# Patient Record
Sex: Female | Born: 1985 | Race: White | Hispanic: No | Marital: Married | Smoking: Current every day smoker
Health system: Southern US, Community
[De-identification: ages and names within clinical notes are randomized; demographics above are authoritative.]

## PROBLEM LIST (undated history)

## (undated) DIAGNOSIS — Z8041 Family history of malignant neoplasm of ovary: Secondary | ICD-10-CM

## (undated) DIAGNOSIS — F419 Anxiety disorder, unspecified: Secondary | ICD-10-CM

## (undated) DIAGNOSIS — F53 Postpartum depression: Secondary | ICD-10-CM

## (undated) DIAGNOSIS — F32A Depression, unspecified: Secondary | ICD-10-CM

## (undated) DIAGNOSIS — F410 Panic disorder [episodic paroxysmal anxiety] without agoraphobia: Secondary | ICD-10-CM

## (undated) DIAGNOSIS — R87629 Unspecified abnormal cytological findings in specimens from vagina: Secondary | ICD-10-CM

## (undated) DIAGNOSIS — F1911 Other psychoactive substance abuse, in remission: Secondary | ICD-10-CM

## (undated) DIAGNOSIS — IMO0002 Reserved for concepts with insufficient information to code with codable children: Secondary | ICD-10-CM

## (undated) DIAGNOSIS — F99 Mental disorder, not otherwise specified: Secondary | ICD-10-CM

## (undated) DIAGNOSIS — Z8042 Family history of malignant neoplasm of prostate: Secondary | ICD-10-CM

## (undated) DIAGNOSIS — E079 Disorder of thyroid, unspecified: Secondary | ICD-10-CM

## (undated) DIAGNOSIS — Z800 Family history of malignant neoplasm of digestive organs: Secondary | ICD-10-CM

## (undated) DIAGNOSIS — O99345 Other mental disorders complicating the puerperium: Secondary | ICD-10-CM

## (undated) HISTORY — DX: Family history of malignant neoplasm of digestive organs: Z80.0

## (undated) HISTORY — DX: Other psychoactive substance abuse, in remission: F19.11

## (undated) HISTORY — DX: Family history of malignant neoplasm of prostate: Z80.42

## (undated) HISTORY — DX: Anxiety disorder, unspecified: F41.9

## (undated) HISTORY — DX: Family history of malignant neoplasm of ovary: Z80.41

## (undated) HISTORY — DX: Depression, unspecified: F32.A

## (undated) HISTORY — DX: Unspecified abnormal cytological findings in specimens from vagina: R87.629

## (undated) HISTORY — PX: TUBAL LIGATION: SHX77

## (undated) HISTORY — DX: Mental disorder, not otherwise specified: F99

## (undated) HISTORY — DX: Postpartum depression: F53.0

## (undated) HISTORY — DX: Other mental disorders complicating the puerperium: O99.345

---
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
## 2003-01-03 ENCOUNTER — Encounter: Payer: Self-pay | Admitting: Emergency Medicine

## 2003-01-03 ENCOUNTER — Emergency Department (HOSPITAL_COMMUNITY): Admission: EM | Admit: 2003-01-03 | Discharge: 2003-01-03 | Payer: Self-pay | Admitting: Emergency Medicine

## 2004-03-29 ENCOUNTER — Ambulatory Visit (HOSPITAL_COMMUNITY): Admission: AD | Admit: 2004-03-29 | Discharge: 2004-03-29 | Payer: Self-pay | Admitting: Obstetrics and Gynecology

## 2004-04-05 ENCOUNTER — Ambulatory Visit (HOSPITAL_COMMUNITY): Admission: AD | Admit: 2004-04-05 | Discharge: 2004-04-05 | Payer: Self-pay | Admitting: Obstetrics and Gynecology

## 2004-04-15 ENCOUNTER — Ambulatory Visit (HOSPITAL_COMMUNITY): Admission: AD | Admit: 2004-04-15 | Discharge: 2004-04-15 | Payer: Self-pay | Admitting: Obstetrics & Gynecology

## 2004-04-20 ENCOUNTER — Ambulatory Visit (HOSPITAL_COMMUNITY): Admission: AD | Admit: 2004-04-20 | Discharge: 2004-04-20 | Payer: Self-pay | Admitting: Obstetrics and Gynecology

## 2004-05-13 ENCOUNTER — Ambulatory Visit (HOSPITAL_COMMUNITY): Admission: AD | Admit: 2004-05-13 | Discharge: 2004-05-13 | Payer: Self-pay | Admitting: Obstetrics and Gynecology

## 2004-05-31 ENCOUNTER — Ambulatory Visit (HOSPITAL_COMMUNITY): Admission: AD | Admit: 2004-05-31 | Discharge: 2004-05-31 | Payer: Self-pay | Admitting: Obstetrics and Gynecology

## 2004-06-21 ENCOUNTER — Inpatient Hospital Stay (HOSPITAL_COMMUNITY): Admission: RE | Admit: 2004-06-21 | Discharge: 2004-06-24 | Payer: Self-pay | Admitting: Obstetrics and Gynecology

## 2004-08-24 ENCOUNTER — Emergency Department (HOSPITAL_COMMUNITY): Admission: EM | Admit: 2004-08-24 | Discharge: 2004-08-24 | Payer: Self-pay | Admitting: Emergency Medicine

## 2004-08-30 ENCOUNTER — Ambulatory Visit (HOSPITAL_COMMUNITY): Admission: RE | Admit: 2004-08-30 | Discharge: 2004-08-30 | Payer: Self-pay | Admitting: Family Medicine

## 2004-11-25 ENCOUNTER — Emergency Department (HOSPITAL_COMMUNITY): Admission: EM | Admit: 2004-11-25 | Discharge: 2004-11-25 | Payer: Self-pay | Admitting: Emergency Medicine

## 2005-01-06 ENCOUNTER — Encounter (HOSPITAL_COMMUNITY): Admission: RE | Admit: 2005-01-06 | Discharge: 2005-01-08 | Payer: Self-pay | Admitting: Family Medicine

## 2005-02-14 ENCOUNTER — Emergency Department (HOSPITAL_COMMUNITY): Admission: EM | Admit: 2005-02-14 | Discharge: 2005-02-14 | Payer: Self-pay | Admitting: Emergency Medicine

## 2005-03-04 ENCOUNTER — Encounter (HOSPITAL_COMMUNITY): Admission: RE | Admit: 2005-03-04 | Discharge: 2005-04-03 | Payer: Self-pay | Admitting: Endocrinology

## 2006-01-31 ENCOUNTER — Ambulatory Visit: Payer: Self-pay | Admitting: Oncology

## 2006-04-16 ENCOUNTER — Emergency Department (HOSPITAL_COMMUNITY): Admission: EM | Admit: 2006-04-16 | Discharge: 2006-04-16 | Payer: Self-pay | Admitting: Emergency Medicine

## 2006-08-06 ENCOUNTER — Ambulatory Visit (HOSPITAL_COMMUNITY): Admission: RE | Admit: 2006-08-06 | Discharge: 2006-08-06 | Payer: Self-pay | Admitting: Obstetrics and Gynecology

## 2006-08-23 ENCOUNTER — Ambulatory Visit (HOSPITAL_COMMUNITY): Admission: AD | Admit: 2006-08-23 | Discharge: 2006-08-23 | Payer: Self-pay | Admitting: Obstetrics and Gynecology

## 2006-09-11 ENCOUNTER — Observation Stay (HOSPITAL_COMMUNITY): Admission: RE | Admit: 2006-09-11 | Discharge: 2006-09-12 | Payer: Self-pay | Admitting: Obstetrics and Gynecology

## 2006-10-01 ENCOUNTER — Ambulatory Visit (HOSPITAL_COMMUNITY): Admission: AD | Admit: 2006-10-01 | Discharge: 2006-10-01 | Payer: Self-pay | Admitting: Obstetrics and Gynecology

## 2006-10-14 ENCOUNTER — Inpatient Hospital Stay (HOSPITAL_COMMUNITY): Admission: AD | Admit: 2006-10-14 | Discharge: 2006-10-15 | Payer: Self-pay | Admitting: Obstetrics and Gynecology

## 2006-10-21 ENCOUNTER — Inpatient Hospital Stay (HOSPITAL_COMMUNITY): Admission: AD | Admit: 2006-10-21 | Discharge: 2006-10-21 | Payer: Self-pay | Admitting: Obstetrics and Gynecology

## 2006-10-25 ENCOUNTER — Inpatient Hospital Stay (HOSPITAL_COMMUNITY): Admission: AD | Admit: 2006-10-25 | Discharge: 2006-10-27 | Payer: Self-pay | Admitting: Obstetrics and Gynecology

## 2007-01-17 ENCOUNTER — Ambulatory Visit (HOSPITAL_COMMUNITY): Admission: RE | Admit: 2007-01-17 | Discharge: 2007-01-17 | Payer: Self-pay | Admitting: Family Medicine

## 2007-01-19 ENCOUNTER — Encounter: Payer: Self-pay | Admitting: Orthopedic Surgery

## 2007-03-14 ENCOUNTER — Telehealth: Payer: Self-pay | Admitting: Orthopedic Surgery

## 2007-03-14 ENCOUNTER — Ambulatory Visit: Payer: Self-pay | Admitting: Orthopedic Surgery

## 2007-03-14 DIAGNOSIS — M549 Dorsalgia, unspecified: Secondary | ICD-10-CM | POA: Insufficient documentation

## 2007-03-16 ENCOUNTER — Telehealth: Payer: Self-pay | Admitting: Orthopedic Surgery

## 2007-06-08 ENCOUNTER — Ambulatory Visit (HOSPITAL_COMMUNITY): Admission: RE | Admit: 2007-06-08 | Discharge: 2007-06-08 | Payer: Self-pay | Admitting: Family Medicine

## 2007-07-02 ENCOUNTER — Ambulatory Visit (HOSPITAL_COMMUNITY): Admission: RE | Admit: 2007-07-02 | Discharge: 2007-07-02 | Payer: Self-pay | Admitting: Obstetrics and Gynecology

## 2007-07-08 ENCOUNTER — Encounter: Payer: Self-pay | Admitting: Physician Assistant

## 2007-07-24 ENCOUNTER — Other Ambulatory Visit: Admission: RE | Admit: 2007-07-24 | Discharge: 2007-07-24 | Payer: Self-pay | Admitting: Obstetrics and Gynecology

## 2007-07-24 ENCOUNTER — Encounter: Payer: Self-pay | Admitting: Physician Assistant

## 2007-07-30 ENCOUNTER — Encounter: Payer: Self-pay | Admitting: Physician Assistant

## 2007-07-30 ENCOUNTER — Ambulatory Visit (HOSPITAL_COMMUNITY): Admission: RE | Admit: 2007-07-30 | Discharge: 2007-07-30 | Payer: Self-pay | Admitting: Obstetrics and Gynecology

## 2007-10-20 ENCOUNTER — Emergency Department (HOSPITAL_COMMUNITY): Admission: EM | Admit: 2007-10-20 | Discharge: 2007-10-20 | Payer: Self-pay | Admitting: Emergency Medicine

## 2007-12-05 ENCOUNTER — Emergency Department (HOSPITAL_COMMUNITY): Admission: EM | Admit: 2007-12-05 | Discharge: 2007-12-05 | Payer: Self-pay | Admitting: Emergency Medicine

## 2008-09-13 ENCOUNTER — Emergency Department (HOSPITAL_COMMUNITY): Admission: EM | Admit: 2008-09-13 | Discharge: 2008-09-13 | Payer: Self-pay | Admitting: Emergency Medicine

## 2008-11-02 ENCOUNTER — Emergency Department (HOSPITAL_COMMUNITY): Admission: EM | Admit: 2008-11-02 | Discharge: 2008-11-02 | Payer: Self-pay | Admitting: Emergency Medicine

## 2008-12-21 ENCOUNTER — Emergency Department (HOSPITAL_COMMUNITY): Admission: EM | Admit: 2008-12-21 | Discharge: 2008-12-21 | Payer: Self-pay | Admitting: Emergency Medicine

## 2009-03-11 ENCOUNTER — Other Ambulatory Visit: Admission: RE | Admit: 2009-03-11 | Discharge: 2009-03-11 | Payer: Self-pay | Admitting: Obstetrics & Gynecology

## 2009-03-12 ENCOUNTER — Encounter: Payer: Self-pay | Admitting: Physician Assistant

## 2009-05-01 ENCOUNTER — Emergency Department (HOSPITAL_COMMUNITY): Admission: EM | Admit: 2009-05-01 | Discharge: 2009-05-02 | Payer: Self-pay | Admitting: Emergency Medicine

## 2009-05-15 ENCOUNTER — Ambulatory Visit: Payer: Self-pay | Admitting: Psychiatry

## 2009-05-15 ENCOUNTER — Inpatient Hospital Stay (HOSPITAL_COMMUNITY): Admission: RE | Admit: 2009-05-15 | Discharge: 2009-05-18 | Payer: Self-pay | Admitting: Psychiatry

## 2009-09-24 ENCOUNTER — Emergency Department (HOSPITAL_COMMUNITY): Admission: EM | Admit: 2009-09-24 | Discharge: 2009-09-24 | Payer: Self-pay | Admitting: Emergency Medicine

## 2009-09-26 ENCOUNTER — Inpatient Hospital Stay (HOSPITAL_COMMUNITY): Admission: EM | Admit: 2009-09-26 | Discharge: 2009-09-28 | Payer: Self-pay | Admitting: Emergency Medicine

## 2009-09-28 ENCOUNTER — Inpatient Hospital Stay (HOSPITAL_COMMUNITY): Admission: AD | Admit: 2009-09-28 | Discharge: 2009-10-02 | Payer: Self-pay | Admitting: Psychiatry

## 2009-09-28 ENCOUNTER — Ambulatory Visit: Payer: Self-pay | Admitting: Psychiatry

## 2009-11-10 ENCOUNTER — Ambulatory Visit: Payer: Self-pay | Admitting: Physician Assistant

## 2009-11-10 ENCOUNTER — Other Ambulatory Visit: Admission: RE | Admit: 2009-11-10 | Discharge: 2009-11-10 | Payer: Self-pay | Admitting: Internal Medicine

## 2009-11-10 DIAGNOSIS — N912 Amenorrhea, unspecified: Secondary | ICD-10-CM | POA: Insufficient documentation

## 2009-11-10 DIAGNOSIS — E018 Other iodine-deficiency related thyroid disorders and allied conditions: Secondary | ICD-10-CM | POA: Insufficient documentation

## 2009-11-10 DIAGNOSIS — E039 Hypothyroidism, unspecified: Secondary | ICD-10-CM | POA: Insufficient documentation

## 2009-11-10 DIAGNOSIS — N739 Female pelvic inflammatory disease, unspecified: Secondary | ICD-10-CM | POA: Insufficient documentation

## 2009-11-10 DIAGNOSIS — F1910 Other psychoactive substance abuse, uncomplicated: Secondary | ICD-10-CM | POA: Insufficient documentation

## 2009-11-10 LAB — CONVERTED CEMR LAB
Beta hcg, urine, semiquantitative: NEGATIVE
Bilirubin Urine: NEGATIVE
Glucose, Urine, Semiquant: NEGATIVE
Protein, U semiquant: NEGATIVE
Specific Gravity, Urine: 1.02
pH: 5.5

## 2009-11-11 ENCOUNTER — Encounter: Payer: Self-pay | Admitting: Physician Assistant

## 2009-11-12 LAB — CONVERTED CEMR LAB
ALT: 17 units/L (ref 0–35)
AST: 16 units/L (ref 0–37)
Basophils Absolute: 0 10*3/uL (ref 0.0–0.1)
Basophils Relative: 1 % (ref 0–1)
CO2: 26 meq/L (ref 19–32)
Calcium: 10.4 mg/dL (ref 8.4–10.5)
Chlamydia, DNA Probe: NEGATIVE
Chloride: 103 meq/L (ref 96–112)
Creatinine, Ser: 0.51 mg/dL (ref 0.40–1.20)
GC Probe Amp, Genital: NEGATIVE
Hemoglobin: 14.9 g/dL (ref 12.0–15.0)
Lymphocytes Relative: 19 % (ref 12–46)
MCHC: 33.7 g/dL (ref 30.0–36.0)
Monocytes Absolute: 0.7 10*3/uL (ref 0.1–1.0)
Neutro Abs: 5.9 10*3/uL (ref 1.7–7.7)
Neutrophils Relative %: 72 % (ref 43–77)
Platelets: 270 10*3/uL (ref 150–400)
Potassium: 4.9 meq/L (ref 3.5–5.3)
RDW: 12.5 % (ref 11.5–15.5)
Sodium: 140 meq/L (ref 135–145)
TSH: 0.02 microintl units/mL — ABNORMAL LOW (ref 0.350–4.500)
Total Protein: 7.1 g/dL (ref 6.0–8.3)

## 2009-11-13 ENCOUNTER — Encounter: Payer: Self-pay | Admitting: Physician Assistant

## 2009-11-18 ENCOUNTER — Ambulatory Visit: Payer: Self-pay | Admitting: Physician Assistant

## 2009-11-18 ENCOUNTER — Ambulatory Visit (HOSPITAL_COMMUNITY): Admission: RE | Admit: 2009-11-18 | Discharge: 2009-11-18 | Payer: Self-pay | Admitting: Internal Medicine

## 2009-11-18 DIAGNOSIS — R109 Unspecified abdominal pain: Secondary | ICD-10-CM | POA: Insufficient documentation

## 2009-11-23 ENCOUNTER — Encounter: Payer: Self-pay | Admitting: Physician Assistant

## 2009-11-24 ENCOUNTER — Encounter (INDEPENDENT_AMBULATORY_CARE_PROVIDER_SITE_OTHER): Payer: Self-pay | Admitting: Internal Medicine

## 2009-12-11 ENCOUNTER — Ambulatory Visit: Payer: Self-pay | Admitting: Physician Assistant

## 2009-12-15 LAB — CONVERTED CEMR LAB: TSH: 0.019 microintl units/mL — ABNORMAL LOW (ref 0.350–4.500)

## 2010-01-09 ENCOUNTER — Emergency Department (HOSPITAL_COMMUNITY): Admission: EM | Admit: 2010-01-09 | Discharge: 2010-01-09 | Payer: Self-pay | Admitting: Emergency Medicine

## 2010-01-26 ENCOUNTER — Encounter: Payer: Self-pay | Admitting: Physician Assistant

## 2010-01-26 DIAGNOSIS — F419 Anxiety disorder, unspecified: Secondary | ICD-10-CM | POA: Insufficient documentation

## 2010-01-26 DIAGNOSIS — F411 Generalized anxiety disorder: Secondary | ICD-10-CM

## 2010-01-26 DIAGNOSIS — E785 Hyperlipidemia, unspecified: Secondary | ICD-10-CM | POA: Insufficient documentation

## 2010-03-26 ENCOUNTER — Encounter (INDEPENDENT_AMBULATORY_CARE_PROVIDER_SITE_OTHER): Payer: Self-pay | Admitting: Internal Medicine

## 2010-03-26 ENCOUNTER — Ambulatory Visit: Payer: Self-pay | Admitting: Internal Medicine

## 2010-03-26 DIAGNOSIS — R002 Palpitations: Secondary | ICD-10-CM | POA: Insufficient documentation

## 2010-03-26 DIAGNOSIS — E663 Overweight: Secondary | ICD-10-CM | POA: Insufficient documentation

## 2010-03-26 DIAGNOSIS — B9789 Other viral agents as the cause of diseases classified elsewhere: Secondary | ICD-10-CM | POA: Insufficient documentation

## 2010-03-26 DIAGNOSIS — J039 Acute tonsillitis, unspecified: Secondary | ICD-10-CM | POA: Insufficient documentation

## 2010-03-26 LAB — CONVERTED CEMR LAB: Rapid Strep: NEGATIVE

## 2010-03-30 LAB — CONVERTED CEMR LAB
Mono Screen: NEGATIVE
TSH: 0.032 microintl units/mL — ABNORMAL LOW (ref 0.350–4.500)

## 2010-05-02 ENCOUNTER — Encounter: Payer: Self-pay | Admitting: Orthopedic Surgery

## 2010-05-12 ENCOUNTER — Ambulatory Visit: Admit: 2010-05-12 | Payer: Self-pay | Admitting: Internal Medicine

## 2010-05-13 NOTE — Letter (Signed)
Summary: [facility]DS [facility]  [facility] REC[facility]DS FROM [facility]   Imported By: Arta Bruce 12/01/2009 12:20:34  _____________________________________________________________________  External Attachment:    Type:   Image     Comment:   External Document

## 2010-05-13 NOTE — Letter (Signed)
Summary: PT INFORMATION SHEET  PT INFORMATION SHEET   Imported By: Arta Bruce 12/11/2009 15:44:59  _____________________________________________________________________  External Attachment:    Type:   Image     Comment:   External Document

## 2010-05-13 NOTE — Assessment & Plan Note (Signed)
Summary: pelvic discharge//ss   Vital Signs:  Patient profile:   25 year old female Height:      66.5 inches Weight:      168 pounds BMI:     26.81 Temp:     97.9 degrees F oral Pulse rate:   102 / minute Pulse rhythm:   regular Resp:     18 per minute BP sitting:   109 / 72  (left arm) Cuff size:   regular  Vitals Entered By: Armenia Shannon (November 10, 2009 8:44 AM) CC: NP... pt says she has a discharge (yellowish tan)and has cramping .Marland Kitchen.. pt says she is not sexually active... pt says she has not had a cycle this month Is Patient Diabetic? No Pain Assessment Patient in pain? no       Does patient need assistance? Functional Status Self care Ambulation Normal   Primary Care Provider:  Tereso Newcomer, PA-C  CC:  NP... pt says she has a discharge (yellowish tan)and has cramping .Marland Kitchen.. pt says she is not sexually active... pt says she has not had a cycle this month.  History of Present Illness: New patient.  Moved here from [place].  Psych:  Seeing Dr. Maisie Fus Heading.  Referred to psych after d/c from hospital.  [facility] set her up with the referral after d/c.  This is a substance abuse program.  Lives there.  She was abusing pain meds and had experimented with bath salts.  She was admx in 09/2009 and was followed for several days.  She had not signs of rhabdomyolysis and the hosp. team was in consult with poison control.  She stayed in [facility] for a week.  Has been in [facility] for about a month.  States she is clean (no alcohol or drugs).  Does smoke cigs.  Vaginal discharge and pelvic pain:  No period since beginning of June.  Had abnl pap in Jan.  Never had f/u.  Had cryosurgery at age 43 for "cancer."  Periods usually regular.  Never had any problems until 2 months ago.  Suprapubic pressure.  No dysuria.  No frequency or hesitancy.  Vaginal discharge is yellowish or tan color.  Pain is constant.  No intercourse since July 3.  Does think she may have had multiple  partners.  She lost several days when on drugs recently.  HIV, RPR and GC tests were neg in hosp in June.  Discharge was not noted until recently.    Current Medications (verified): 1)  Synthroid 125 Mcg Tabs (Levothyroxine Sodium) .... Two Tabs Once A Day  Allergies (verified): 1)  ! Pcn 2)  ! Amoxicillin  Past History:  Past Medical History: Graves Disease   a.  s/p Radioactive Iodine   b.  hypothyroidism migraines cervical cancer at age 80 - cryosurgery  Past Surgical History:  Tubal ligation (2008)  Family History: FH of Cancer: Mom - Breast (died at 14) Family History of Diabetes - grandfather CAD - PGF   Social History: Patient is married.  2 kids Recovering addict   a.  abused prescription pain meds and  bath salts   b.  admx in 09/2009   c.  in [facility] for 10/2009 to 10/2010 + cigs (4 cigs a day)  Physical Exam  General:  alert, well-developed, and well-nourished.   Head:  normocephalic and atraumatic.   Neck:  supple and no thyromegaly.   Lungs:  normal breath sounds.   Heart:  normal rate and regular  rhythm.   Abdomen:  soft, normal bowel sounds, and no hepatomegaly.   Genitalia:  normal introitus, no external lesions, mucosa pink and moist, and no vaginal atrophy.   + CMT + adnexal tenderness  Neurologic:  alert & oriented X3 and cranial nerves II-XII intact.   Psych:  normally interactive.     Impression & Recommendations:  Problem # 1:  PELVIC INFLAMMATORY DISEASE (ICD-614.9)  with her symptoms I am concerned she has PID will treat her for such PCN allergic . Marland Kitchen . will not give rocephin doxy and flagyl x 2 weeks  Orders: T-Culture, Urine ([id number]) T- GC Chlamydia (56213) T-Pap Smear, Thin Prep (08657) KOH/ WET [place] [phone]) T-CBC w/Diff 250 143 9020) T-Urinalysis (40102-72536)  Problem # 2:  PREVENTIVE HEALTH CARE (ICD-V70.0)  has h/o abnl paps pap done today refer to gyn if abnl  Orders: T- GC Chlamydia (64403) T-Pap  Smear, Thin Prep (47425) KOH/ WET [place] [phone]) T-Comprehensive Metabolic Panel [phone])  Problem # 3:  SUBSTANCE ABUSE, MULTIPLE (ICD-305.90)  recovering clean for a month now   Orders: T-Comprehensive Metabolic Panel ([id number])  Problem # 4:  HYPOTHYROIDISM, 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 Two tabs once a day 2)  Doxycycline Hyclate 100 Mg Tabs (Doxycycline hyclate) .... Take 1 tablet by mouth two times a day 3)  Flagyl 500 Mg Tabs (Metronidazole) .... Take 1 tablet by mouth two times a day  Other Orders: Urine Pregnancy Test  (70623)  Patient Instructions: 1)  Please request records from [facility] (Dr. Emelda Fear) in [place]. 2)  Take antibiotics until all gone. 3)  Follow up with Scott in 7 to 10 days. Prescriptions: FLAGYL 500 MG TABS (METRONIDAZOLE) Take 1 tablet by mouth two times a day  #28 x 0   Entered and Authorized by:   Tereso Newcomer PA-C   Signed by:   Tereso Newcomer PA-C on 11/10/2009   Method used:   Print then Give to Patient   RxID:   [id number] DOXYCYCLINE HYCLATE 100 MG TABS (DOXYCYCLINE HYCLATE) Take 1 tablet by mouth two times a day  #28 x 0   Entered and Authorized by:   Tereso Newcomer PA-C   Signed by:   Tereso Newcomer PA-C on 11/10/2009   Method used:   Print then Give to Patient   RxID:   [id number]   Laboratory Results   Urine Tests    Routine Urinalysis   Glucose: negative   (Normal Range: Negative) Bilirubin: negative   (Normal Range: Negative) Ketone: trace (5)   (Normal Range: Negative) Spec. Gravity: 1.020   (Normal Range: 1.003-1.035) Blood: negative   (Normal Range: Negative) pH: 5.5   (Normal Range: 5.0-8.0) Protein: negative    (Normal Range: Negative) Urobilinogen: 0.2   (Normal Range: 0-1) Nitrite: negative   (Normal Range: Negative) Leukocyte Esterace: trace   (Normal Range: Negative)    Urine HCG: negative

## 2010-05-13 NOTE — Letter (Signed)
Summary: ANCILLARY TESTING REPORT  ANCILLARY TESTING REPORT   Imported By: Arta Bruce 02/05/2010 12:45:59  _____________________________________________________________________  External Attachment:    Type:   Image     Comment:   External Document

## 2010-05-13 NOTE — Letter (Signed)
Summary: GYNECOLOGIC CYTOLOGY REPORT  GYNECOLOGIC CYTOLOGY REPORT   Imported By: Arta Bruce 02/05/2010 12:50:22  _____________________________________________________________________  External Attachment:    Type:   Image     Comment:   External Document

## 2010-05-13 NOTE — Assessment & Plan Note (Signed)
Summary: 3 MONTH FU ON THYROID//KT   Vital Signs:  Patient profile:   25 year old female Menstrual status:  regular LMP:     03/19/2010 Weight:      179.25 pounds Temp:     97.0 degrees F oral Pulse rate:   96 / minute Pulse rhythm:   regular Resp:     12 per minute BP sitting:   112 / 68  (left arm) Cuff size:   regular  Vitals Entered By: Hale Drone CMA (March 26, 2010 9:57 AM) CC: 3 month f/u on thryoid. Feeling sick. Coughing, congested, and body aching x4 days. Denies fever, vomiting and diarrhea. Roomate has Mono and she is concerned. Also went to the ER in October for chest pains.  Is Patient Diabetic? No Pain Assessment Patient in pain? no       Does patient need assistance? Functional Status Self care Ambulation Normal LMP (date): 03/19/2010     Menstrual Status regular Enter LMP: 03/19/2010 Last PAP Result NEGATIVE FOR INTRAEPITHELIAL LESIONS OR MALIGNANCY.   Primary Care Provider:  Tereso Newcomer, PA-C  CC:  3 month f/u on thryoid. Feeling sick. Coughing, congested, and body aching x4 days. Denies fever, and vomiting and diarrhea. Roomate has Mono and she is concerned. Also went to the ER in October for chest pains. .  History of Present Illness: 1.  Generalized myalgias, nonproductive cough, congestion for about 4-5 days.  No fevers.  No dyspnea.  No N, V, diarrhea.    Throat feels swollen, but not painful Roommate with mono--diagnosed 2 weeks ago.   Has not noted lymph nodes in anterior cervical area to feel swollen.  Taking fluids and eating okay.    2.  Hypothyroidism:   Was on 200 micrograms previously and TSH suppressed.  Has been on 175 since September.  Pt. has not noted significant change in symptoms with decrease.    3.  Overweight:  Pt. is working with nutritionist.  Weight at last visit was listed at 144--inactuality was 164 per pt., which makes more sense.    4.  Chest Pain:  No longer with pain.  Felt to be noncardiac.  Ddimer, CIEs, CXR, EKG  other than isolated PVCs normal.  Just now having fluttering in chest--less frequent than when seen in ED--no mention of sustained ventricular arrhythmia from ED.  Was noted to be overly replaced with thyroid during same time period.  Current Medications (verified): 1)  Synthroid 175 Mcg Tabs (Levothyroxine Sodium) .... Take 1 Tablet By Mouth Once A Day (Dose Decreased)  Allergies (verified): 1)  ! Pcn 2)  ! Amoxicillin  Physical Exam  General:  NAD--sounds congested Eyes:  No corneal or conjunctival inflammation noted. EOMI. Perrla. Funduscopic exam benign, without hemorrhages, exudates or papilledema. Vision grossly normal. Ears:  External ear exam shows no significant lesions or deformities.  Otoscopic examination reveals clear canals, tympanic membranes are intact bilaterally without bulging, retraction, inflammation or discharge. Hearing is grossly normal bilaterally. Nose:  mild nasal mucosal swelling with clear discharge Mouth:  Uvula and tonsillar area erythematous with some small hemorrhages.  No exudate noted. Neck:  No anterior cervical adenopathy,NT.  No mass Lungs:  Normal respiratory effort, chest expands symmetrically. Lungs are clear to auscultation, no crackles or wheezes. Heart:  Normal rate and regular rhythm with occasional premature beat.  Borderline tachycardia. S1 and S2 normal without gallop, murmur, click, rub or other extra sounds.   Impression & Recommendations:  Problem # 1:  VIRAL INFECTION,  ACUTE (ICD-079.99) Discussed may have mono and testing this early may be negative, but will check. Supportive care Good respiratory hygiene to avoid passing onto daughter and others Orders: T- * Misc. Laboratory test [phone]) Rapid Strep (60454) Negative as suspected  Problem # 2:  PALPITATIONS (ICD-785.1)  Orders: EKG w/ Interpretation (93000)  Occasional pvc--HR down to 74 with EKG--may not be related to Synthroid oversuppresion  Problem # 3:  HYPOTHYROIDISM,  POST-RADIOACTIVE IODINE (ICD-244.2) Palpitations may be related to this Her updated medication list for this problem includes:    Synthroid 175 Mcg Tabs (Levothyroxine sodium) .Marland Kitchen... Take 1 tablet by mouth once a day (dose decreased)  Orders: T-TSH ([id number])  Problem # 4:  OVERWEIGHT (ICD-278.02) Pt. continuing to work with Nutrition  Complete Medication List: 1)  Synthroid 175 Mcg Tabs (Levothyroxine sodium) .... Take 1 tablet by mouth once a day (dose decreased)  Other Orders: Flu Vaccine 77yrs + (29562) Admin 1st Vaccine (13086)  Patient Instructions: 1)  Ibuprofen 600-800 mg with soft food every 6 hours as needed for sore throat, swelling or if develop fever 2)  Cool liquids--push this 3)  Good hand washing 4)  cough into elbow 5)  Do not share drinks, cups, utensils   Orders Added: 1)  Flu Vaccine 63yrs + [90658] 2)  Admin 1st Vaccine [90471] 3)  Est. Patient Level IV [57846] 4)  EKG w/ Interpretation [93000] 5)  T-TSH [96295-28413] 6)  T- * Misc. Laboratory test [99999] 7)  Rapid Strep [87880]    Laboratory Results  Date/Time Received: March 26, 2010 11:07 AM   Other Tests  Rapid Strep: negative

## 2010-05-13 NOTE — Letter (Signed)
Summary: MED /SOLUTIONS//APPROVED  MED /SOLUTIONS//APPROVED   Imported By: Arta Bruce 11/23/2009 12:47:46  _____________________________________________________________________  External Attachment:    Type:   Image     Comment:   External Document

## 2010-05-13 NOTE — Op Note (Signed)
Summary: Operative Report//[place]  Operative Report//[place]   Imported By: Arta Bruce 02/05/2010 12:48:59  _____________________________________________________________________  External Attachment:    Type:   Image     Comment:   External Document

## 2010-05-13 NOTE — Miscellaneous (Signed)
  Clinical Lists Changes  Problems: Added new problem of ANXIETY (ICD-300.00) Added new problem of HYPERLIPIDEMIA (ICD-272.4) Observations: Added new observation of HYPRLIPIDMIA: yes (01/26/2010 16:55) Added new observation of PMH ANXIETY: yes (01/26/2010 16:55) Added new observation of PAST MED HX: Graves Disease   a.  s/p Radioactive Iodine   b.  hypothyroidism migraines cervical cancer at age 25 - cryosurgery   a.  LGSIL 12.2010 Anxiety Hyperlipidemia  (01/26/2010 16:55)       Past History:  Past Medical History: Graves Disease   a.  s/p Radioactive Iodine   b.  hypothyroidism migraines cervical cancer at age 13 - cryosurgery   a.  LGSIL 12.2010 Anxiety Hyperlipidemia

## 2010-05-13 NOTE — Assessment & Plan Note (Signed)
Summary: FU WITH SCOTT IN 7-TO DAYS//GK   Vital Signs:  Patient profile:   25 year old female Height:      66.5 inches Weight:      144 pounds BMI:     22.98 Temp:     97.7 degrees F oral Pulse rate:   92 / minute Pulse rhythm:   regular Resp:     18 per minute BP sitting:   110 / 74  (left arm) Cuff size:   large  Vitals Entered By: Arm[place] Shannon (November 18, 2009 3:07 PM) CC: follow-up visit, still having pelvic pain Is Patient Diabetic? No Pain Assessment Patient in pain? yes     Location: pelvis Intensity: 5 Type: sharp  Does patient need assistance? Functional Status Self care Ambulation Normal   Primary Care Provider:  Tereso Newcomer, PA-C  CC:  follow-up visit and still having pelvic pain.  History of Present Illness: Here for f/u.  GC and Chlam tests were neg.  Pap normal.  Tranvag u/s done today was ok.  She had a lot of pain after this.  Still have pelvic pressure.  No intercourse.  No more discharge.  DId have fevers this past weekend (had chills).  Now has some mild URI symptoms.    Current Medications (verified): 1)  Synthroid 125 Mcg Tabs (Levothyroxine Sodium) .... Take 1 By Mouth Once Daily With 75 Micrograms Tablet To Equal 200 Micrograms 2)  Doxycycline Hyclate 100 Mg Tabs (Doxycycline Hyclate) .... Take 1 Tablet By Mouth Two Times A Day 3)  Flagyl 500 Mg Tabs (Metronidazole) .... Take 1 Tablet By Mouth Two Times A Day 4)  Synthroid 75 Mcg Tabs (Levothyroxine Sodium) .... Take 1 Tablet By Mouth Once A Day With 125 Micrograms Tablet To Equal 200 Micrograms  Allergies (verified): 1)  ! Pcn 2)  ! Amoxicillin  Physical Exam  General:  alert, well-developed, and well-nourished.   Head:  normocephalic and atraumatic.   Neck:  supple and no thyromegaly.   Lungs:  normal breath sounds.   Heart:  normal rate and regular rhythm.   Abdomen:  soft, normal bowel sounds, and no hepatomegaly. + pelvic pain with palp   Neurologic:  alert & oriented X3 and  cranial nerves II-XII intact.     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 Take 1 by mouth once daily with 75 micrograms tablet to equal 200 micrograms    Synthroid 75 Mcg Tabs (Levothyroxine sodium) .Marland Kitchen... Take 1 tablet by mouth once a day with 125 micrograms tablet to equal 200 micrograms  Problem # 2:  PELVIC  PAIN (ICD-789.09)  tx for PID gc and chlam neg pap ok u/s normal ? etiology of pain will send to gyn for eval  Orders: Gynecologic Referral (Gyn)  Problem # 3:  AMENORRHEA (ICD-626.0) ? related to thyroid when levels ok, could try progesterone challenge if still no period  Complete Medication List: 1)  Synthroid 125 Mcg Tabs (Levothyroxine sodium) .... Take 1 by mouth once daily with 75 micrograms tablet to equal 200 micrograms 2)  Doxycycline Hyclate 100 Mg Tabs (Doxycycline hyclate) .... Take 1 tablet by mouth two times a day 3)  Flagyl 500 Mg Tabs (Metronidazole) .... Take 1 tablet by mouth two times a day 4)  Synthroid 75 Mcg Tabs (Levothyroxine sodium) .Marland KitchenMarland KitchenMarland Kitchen  Take 1 tablet by mouth once a day with 125 micrograms tablet to equal 200 micrograms  Patient Instructions: 1)  Arrange lab visit for week of 12/11/2009 for TSH, Free T4 and Free T3 (244.2). 2)  Your periods may normalize once your thyroid is normal. 3)  If your periods do not return after we stop adjusting your medicine (give it at least 6 weeks on a stable dose), schedule a follow up. 4)  Someone will call you to set up an appointment with gynecology.  Please call me if you do not hear anything in 2 weeks. 5)  You can take Ibuprofen 600 mg with food every 6-8 hours as needed for pain. 6)  Please schedule a follow-up appointment in 3 months for thyroid.

## 2010-05-13 NOTE — Letter (Signed)
Summary: GYNECOLOGIC CYTOLOGY REPORT  GYNECOLOGIC CYTOLOGY REPORT   Imported By: Arta Bruce 02/05/2010 12:44:26  _____________________________________________________________________  External Attachment:    Type:   Image     Comment:   External Document

## 2010-05-13 NOTE — Letter (Signed)
Summary: *HSN Results Follow up  [facility]  [address] [place], [place] [address]   Phone: [phone]  Fax: [phone]      11/13/2009   [place] [facility] [address] [place], [place]  [address]   Dear  Ms. Shellee Whatley,                            ____S.Drinkard,FNP   ____D. Gore,FNP       ____B. McPherson,MD   ____V. Rankins,MD    ____E. Mulberry,MD    ____N. Daphine Deutscher, FNP  ____D. Reche Dixon, MD    ____K. Philipp Deputy, MD    __x__S. Alben Spittle, PA-C     This letter is to inform you that your recent test(s):  ___x____Pap Smear   is normal  ___x____Lab Test  - urine culture negative for bladder infectoin   _______X-ray    _______ is within acceptable limits  _______ requires a medication change  _______ requires a follow-up lab visit  _______ requires a follow-up visit with your provider   Comments:       _________________________________________________________ If you have any questions, please contact our office                     Sincerely,  Tereso Newcomer PA-C [facility]

## 2010-05-13 NOTE — Letter (Signed)
Summary: RADIOLOGY REPORT  RADIOLOGY REPORT   Imported By: Arta Bruce 02/05/2010 12:42:45  _____________________________________________________________________  External Attachment:    Type:   Image     Comment:   External Document

## 2010-05-17 ENCOUNTER — Encounter (INDEPENDENT_AMBULATORY_CARE_PROVIDER_SITE_OTHER): Payer: Self-pay | Admitting: Internal Medicine

## 2010-06-24 LAB — COMPREHENSIVE METABOLIC PANEL
AST: 22 U/L (ref 0–37)
Albumin: 4 g/dL (ref 3.5–5.2)
BUN: 11 mg/dL (ref 6–23)
Creatinine, Ser: 0.6 mg/dL (ref 0.4–1.2)
GFR calc Af Amer: >60 mL/min (ref >60)
Potassium: 3.7 mEq/L (ref 3.5–5.1)
Total Protein: 6.9 g/dL (ref 6.0–8.3)

## 2010-06-24 LAB — CBC
MCH: 30.9 pg (ref 26.0–34.0)
MCV: 88.1 fL (ref 78.0–100.0)
Platelets: 199 10*3/uL (ref 150–400)
RDW: 12.3 % (ref 11.5–15.5)
WBC: 9.1 10*3/uL (ref 4.0–10.5)

## 2010-06-24 LAB — DIFFERENTIAL
Lymphocytes Relative: 25 % (ref 12–46)
Lymphs Abs: 2.3 10*3/uL (ref 0.7–4.0)
Monocytes Absolute: 0.8 10*3/uL (ref 0.1–1.0)
Monocytes Relative: 9 % (ref 3–12)
Neutro Abs: 5.8 10*3/uL (ref 1.7–7.7)

## 2010-06-24 LAB — POCT CARDIAC MARKERS
CKMB, poc: <1 ng/mL — ABNORMAL LOW (ref 1.0–8.0)
Troponin i, poc: <0.05 ng/mL (ref 0.00–0.09)

## 2010-06-24 LAB — D-DIMER, QUANTITATIVE: D-Dimer, Quant: <0.22 ug/mL-FEU (ref 0.00–0.48)

## 2010-06-27 LAB — URINE CULTURE
Colony Count: NO GROWTH
Culture: NO GROWTH
Special Requests: POSITIVE

## 2010-06-27 LAB — URINALYSIS, ROUTINE W REFLEX MICROSCOPIC
Glucose, UA: NEGATIVE mg/dL
Specific Gravity, Urine: 1.027 (ref 1.005–1.030)
Urobilinogen, UA: 1 mg/dL (ref 0.0–1.0)
pH: 6 (ref 5.0–8.0)

## 2010-06-27 LAB — CARDIAC PANEL(CRET KIN+CKTOT+MB+TROPI)
Relative Index: INVALID (ref 0.0–2.5)
Relative Index: INVALID (ref 0.0–2.5)

## 2010-06-27 LAB — BASIC METABOLIC PANEL
CO2: 27 mEq/L (ref 19–32)
CO2: 29 mEq/L (ref 19–32)
Calcium: 7.8 mg/dL — ABNORMAL LOW (ref 8.4–10.5)
Calcium: 8.3 mg/dL — ABNORMAL LOW (ref 8.4–10.5)
Chloride: 108 mEq/L (ref 96–112)
Chloride: 112 mEq/L (ref 96–112)
GFR calc Af Amer: >60 mL/min (ref >60)
GFR calc non Af Amer: >60 mL/min (ref >60)
GFR calc non Af Amer: >60 mL/min (ref >60)
Glucose, Bld: 112 mg/dL — ABNORMAL HIGH (ref 70–99)
Glucose, Bld: 126 mg/dL — ABNORMAL HIGH (ref 70–99)
Glucose, Bld: 97 mg/dL (ref 70–99)
Potassium: 3.4 mEq/L — ABNORMAL LOW (ref 3.5–5.1)
Potassium: 3.7 mEq/L (ref 3.5–5.1)
Sodium: 143 mEq/L (ref 135–145)
Sodium: 143 mEq/L (ref 135–145)
Sodium: 143 mEq/L (ref 135–145)

## 2010-06-27 LAB — MAGNESIUM: Magnesium: 2.1 mg/dL (ref 1.5–2.5)

## 2010-06-27 LAB — DIFFERENTIAL
Basophils Absolute: 0 10*3/uL (ref 0.0–0.1)
Basophils Relative: 0 % (ref 0–1)
Eosinophils Absolute: 0.1 10*3/uL (ref 0.0–0.7)
Eosinophils Relative: 1 % (ref 0–5)
Neutrophils Relative %: 65 % (ref 43–77)

## 2010-06-27 LAB — COMPREHENSIVE METABOLIC PANEL
ALT: 18 U/L (ref 0–35)
AST: 16 U/L (ref 0–37)
CO2: 28 mEq/L (ref 19–32)
Calcium: 7.8 mg/dL — ABNORMAL LOW (ref 8.4–10.5)
Chloride: 111 mEq/L (ref 96–112)
GFR calc Af Amer: >60 mL/min (ref >60)
GFR calc non Af Amer: >60 mL/min (ref >60)
Glucose, Bld: 137 mg/dL — ABNORMAL HIGH (ref 70–99)
Sodium: 142 mEq/L (ref 135–145)
Total Bilirubin: 0.3 mg/dL (ref 0.3–1.2)

## 2010-06-27 LAB — CBC
HCT: 39.8 % (ref 36.0–46.0)
Hemoglobin: 12.2 g/dL (ref 12.0–15.0)
Hemoglobin: 13.1 g/dL (ref 12.0–15.0)
MCHC: 33.4 g/dL (ref 30.0–36.0)
MCV: 95.3 fL (ref 78.0–100.0)
MCV: 95.5 fL (ref 78.0–100.0)
RBC: 3.85 MIL/uL — ABNORMAL LOW (ref 3.87–5.11)
RDW: 12.9 % (ref 11.5–15.5)
WBC: 8.3 10*3/uL (ref 4.0–10.5)

## 2010-06-27 LAB — RAPID URINE DRUG SCREEN, HOSP PERFORMED
Barbiturates: NOT DETECTED
Opiates: NOT DETECTED
Tetrahydrocannabinol: POSITIVE — AB

## 2010-06-27 LAB — HEPATIC FUNCTION PANEL
Albumin: 3.3 g/dL — ABNORMAL LOW (ref 3.5–5.2)
Alkaline Phosphatase: 56 U/L (ref 39–117)
Indirect Bilirubin: 0.6 mg/dL (ref 0.3–0.9)
Total Bilirubin: 0.7 mg/dL (ref 0.3–1.2)

## 2010-06-27 LAB — CK TOTAL AND CKMB (NOT AT ARMC)
Relative Index: INVALID (ref 0.0–2.5)
Total CK: 67 U/L (ref 7–177)

## 2010-06-27 LAB — URINE MICROSCOPIC-ADD ON

## 2010-06-27 LAB — HIV ANTIBODY (ROUTINE TESTING W REFLEX): HIV: NONREACTIVE

## 2010-06-27 LAB — T4, FREE: Free T4: 2.3 ng/dL — ABNORMAL HIGH (ref 0.80–1.80)

## 2010-06-27 LAB — SALICYLATE LEVEL: Salicylate Lvl: <4 mg/dL (ref 2.8–20.0)

## 2010-06-27 LAB — TROPONIN I: Troponin I: 0.02 ng/mL (ref 0.00–0.06)

## 2010-06-28 LAB — COMPREHENSIVE METABOLIC PANEL
ALT: 11 U/L (ref 0–35)
AST: 18 U/L (ref 0–37)
Albumin: 3.9 g/dL (ref 3.5–5.2)
Alkaline Phosphatase: 70 U/L (ref 39–117)
BUN: 6 mg/dL (ref 6–23)
CO2: 31 mEq/L (ref 19–32)
Calcium: 9.2 mg/dL (ref 8.4–10.5)
Chloride: 102 mEq/L (ref 96–112)
Creatinine, Ser: 0.7 mg/dL (ref 0.4–1.2)
GFR calc Af Amer: >60 mL/min (ref >60)
GFR calc non Af Amer: >60 mL/min (ref >60)
Glucose, Bld: 103 mg/dL — ABNORMAL HIGH (ref 70–99)
Potassium: 3.7 mEq/L (ref 3.5–5.1)
Sodium: 141 mEq/L (ref 135–145)
Total Bilirubin: 0.4 mg/dL (ref 0.3–1.2)
Total Protein: 7.5 g/dL (ref 6.0–8.3)

## 2010-06-28 LAB — RAPID URINE DRUG SCREEN, HOSP PERFORMED
Amphetamines: NOT DETECTED
Barbiturates: NOT DETECTED
Benzodiazepines: NOT DETECTED
Cocaine: NOT DETECTED
Opiates: NOT DETECTED
Tetrahydrocannabinol: POSITIVE — AB

## 2010-06-28 LAB — CBC
HCT: 44.9 % (ref 36.0–46.0)
Hemoglobin: 15 g/dL (ref 12.0–15.0)
MCHC: 33.5 g/dL (ref 30.0–36.0)
MCV: 93.4 fL (ref 78.0–100.0)
Platelets: 309 10*3/uL (ref 150–400)
RBC: 4.81 MIL/uL (ref 3.87–5.11)
RDW: 13.4 % (ref 11.5–15.5)
WBC: 14.4 10*3/uL — ABNORMAL HIGH (ref 4.0–10.5)

## 2010-06-28 LAB — DIFFERENTIAL
Basophils Absolute: 0 10*3/uL (ref 0.0–0.1)
Basophils Relative: 0 % (ref 0–1)
Eosinophils Absolute: 0 10*3/uL (ref 0.0–0.7)
Eosinophils Relative: 0 % (ref 0–5)
Lymphocytes Relative: 9 % — ABNORMAL LOW (ref 12–46)
Lymphs Abs: 1.2 10*3/uL (ref 0.7–4.0)
Monocytes Absolute: 0.4 10*3/uL (ref 0.1–1.0)
Monocytes Relative: 3 % (ref 3–12)
Neutro Abs: 12.7 10*3/uL — ABNORMAL HIGH (ref 1.7–7.7)
Neutrophils Relative %: 88 % — ABNORMAL HIGH (ref 43–77)

## 2010-06-28 LAB — ETHANOL: Alcohol, Ethyl (B): <5 mg/dL (ref 0–10)

## 2010-07-01 LAB — COMPREHENSIVE METABOLIC PANEL
ALT: 12 U/L (ref 0–35)
Calcium: 8.9 mg/dL (ref 8.4–10.5)
GFR calc Af Amer: >60 mL/min (ref >60)
Glucose, Bld: 98 mg/dL (ref 70–99)
Sodium: 135 mEq/L (ref 135–145)
Total Protein: 7.5 g/dL (ref 6.0–8.3)

## 2010-07-01 LAB — CBC
Hemoglobin: 14.8 g/dL (ref 12.0–15.0)
MCHC: 34 g/dL (ref 30.0–36.0)
RDW: 13.8 % (ref 11.5–15.5)

## 2010-07-01 LAB — TSH: TSH: 10.08 u[IU]/mL — ABNORMAL HIGH (ref 0.350–4.500)

## 2010-08-24 NOTE — Op Note (Signed)
[name], [name]                ACCOUNT NO.:  [id number]   MEDICAL RECORD NO.:  [id number]          PATIENT TYPE:  INP   LOCATION:  9118                          FACILITY:  [facility]   PHYSICIAN:  Lazaro Arms, M.D.   DATE OF BIRTH:  06-May-1985   DATE OF PROCEDURE:  10/26/2006  DATE OF DISCHARGE:                               OPERATIVE REPORT   Kelly Richard is a 25 year old white female, gravida 3, para 1, abortus 1 with  estimated date of delivery July 20th at 39-5/7 weeks of gestation.  The  baby has progressed normally through active phase of labor.  She had an  epidural placed.  She is complete and found to be complete and +3  station.  She had two maternal expulsive efforts.  Over an intact  perineum delivered a viable female infant at 34 with Apgars of 9 and  9, weighing 6 pounds, 11 ounces.  There was a three vessel cord.  Cord  blood and cord gas were sent.  Cord blood was collected by [facility].  The placenta was delivered intact.  The uterus was firm,  three finger breadths below the umbilicus.  There was a small abrasion  of the right vulva, otherwise the perineum was intact.  The patient is  to undergo routine postpartum care.  She does have a history of  hypothyroidism and chronic narcotic abuse.  She has made good efforts to  try to wean down those medications during her pregnancy and that will  continue in the postpartum period.  She does want to have an Implenon as  her birth control method.      Lazaro Arms, M.D.  Electronically Signed     LHE/MEDQ  D:  10/26/2006  T:  10/26/2006  Job:  [id number]

## 2010-08-24 NOTE — H&P (Signed)
NAMEROSARY, FILOSA                ACCOUNT NO.:  [id number]   MEDICAL RECORD NO.:  [id number]          PATIENT TYPE:  OIB   LOCATION:  LDR2                          FACILITY:  [facility]   PHYSICIAN:  Tilda Burrow, M.D. DATE OF BIRTH:  1986/01/24   DATE OF ADMISSION:  09/11/2006  DATE OF DISCHARGE:  [facility]                              HISTORY & PHYSICAL   OBSERVATION ADMISSION:   REASON FOR ADMISSION:  Pregnancy at 33 weeks and 3 days with  questionable rupture of membranes.   ALLERGIES:  She is allergic to PENICILLIN.   MEDICATIONS:  She takes prenatal vitamins, Tylox and Synthroid.   PAST MEDICAL HISTORY:  Migraines and hypothyroidism.   PAST SURGICAL HISTORY:  Negative.   FAMILY HISTORY:  Complicated by breast cancer.   PRENATAL COURSE:  Her prenatal course has been complicated by management  of her hypothyroidism and also prescription medication use that had to  be regulated by the physician.  Blood type is O negative.  Rubella is  equivocal.  Hepatitis B surface antigen noted to be negative.  Serologies negative.  Pap was normal.  GC and Chlamydia both cultures  were negative.  AFP normal.  GBS has not been done yet.   PHYSICAL EXAMINATION:  Vital signs are stable.  Fetal heart rate pattern  is stable.  Nitrazine is negative.  There has bene no leaking of any  fluid since patient has been admitted to the unit.   PLAN:  We are going to overnight observation and [facility] in the morning.     Zerita Boers, Lanier Clam      Tilda Burrow, M.D.  Electronically Signed   DL/MEDQ  D:  11/91/4782  T:  09/12/2006  Job:  [id number]   cc:   [facility]

## 2010-08-24 NOTE — H&P (Signed)
NAMEBETTYMAE, Kelly Richard                ACCOUNT NO.:  [id number]   MEDICAL RECORD NO.:  [id number]          PATIENT TYPE:  AMB   LOCATION:  DAY                           FACILITY:  [facility]   PHYSICIAN:  Tilda Burrow, M.D. DATE OF BIRTH:  01-30-1986   DATE OF ADMISSION:  07/30/2007  DATE OF DISCHARGE:  LH                              HISTORY & PHYSICAL   ADMITTING DIAGNOSIS:  Desire for elective permanent sterilization.   HISTORY:  [name] is a 25 year old gravida 3, para 2, AB 1, who  is admitted for elective permanent sterilization.  Her youngest child is  now 45 months old, and she has come back with him desiring to have  permanent sterilization performed.  She has been encouraged to consider  temporary or long-term non-permanent methods such as continuing the  Implanon.  She has lots of breakthrough bleeding on it.  We discussed  other methods such as ring or patch or other more reliable and  reversible methods.  She clearly and constantly states she plans no more  children and wishes to proceed with permanent sterilization.  Plans are  for a Falope-Ring application which has been explained in detail with  the risks and benefits reviewed.  Additionally, the Implanon will be  removed while she is asleep.   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  She has used Percocet as a powder for nasal ingestion as recently  as 2007, but has weaned herself off of all opiates at this point, and  has been without narcotics of any sort for 9 days.  She does not wish to  have any narcotics prescribed for her and will use only Motrin or  Toradol after the surgery for discomforts.   REVIEW OF SYSTEMS:  Positive for chronic back pain.  She used to use  lots of medicines for this.  She started using yoga with success.  She  has migraine headaches managed by Dr. Regino Schultze.  Cigarettes, and diet  rare THC, rare for breast cancer and had abnormal Pap smears in  the  distant past.  The patient's recent Pap smears have  been__normal_________ .   IMPRESSION:  1. Desire for elective permanent sterilization and removal of      Implanon.  2. Chronic pain medication overuse in the past, now with no pain      medicines being used.   PLAN:  Laparoscopic tubal sterilization with Falope-Rings on July 30, 2007.  Preops on July 27, 2007, at [facility].  will avoid opiates postoperatively      Tilda Burrow, M.D.  Electronically Signed     JVF/MEDQ  D:  07/24/2007  T:  07/25/2007  Job:  [id number]

## 2010-08-24 NOTE — Consult Note (Signed)
Kelly Richard, KOROMA NO.:  [id number]   MEDICAL RECORD NO.:  [id number]          PATIENT TYPE:  MAT   LOCATION:  MATC                          FACILITY:  [facility]   PHYSICIAN:  Tilda Burrow, M.D. DATE OF BIRTH:  11-Jun-1985   DATE OF CONSULTATION:  10/21/2006  DATE OF DISCHARGE:                                 CONSULTATION   CHIEF COMPLAINT:  Pregnancy of [redacted] weeks gestation, remote membrane  rupture, observation x1.5 hours.   This 25 year old, gravida 3, para 1, AB-1 at 46 + [redacted] weeks gestation  calls tonight.  We have a phone triage and has questions of leaking  fluid.  Prenatal care has been followed through our office with cervix  long, thick, and closed at last exam.  She describes the discharge as  recurrent and different and moistening her panties x2.  She had a  prenatal course documented with lots of pain and complaints and  analgesic use.  Early pregnancy course was notable for Oxycodone habit,  snorting the powder from Oxycodone.  She seems to have left that behind,  and past several urine drug screens have had appropriate, negative  results.   PAST MEDICAL HISTORY:  1. Migraines.  2. Hypothyroidism.   ALLERGIES:  PENICILLIN.   MEDICATIONS:  Tylox and Synthroid.   A married homemaker, who understands the need to avoid analgesics.   PHYSICAL EXAMINATION:  A reactive fetal heart rate tracing, and no labor  contractions noted.  A speculum exam shows the cervix to be digital with  Nitrazine test negative.   IMPRESSION:  Nitrazine and fern negative; no evidence of membrane  rupture, pregnancy at 38 plus 6 weeks.   PLAN:  Keep appointment as scheduled in three days at [facility].  Ambien 10 mg p.o. given at discharge.      Tilda Burrow, M.D.  Electronically Signed     JVF/MEDQ  D:  10/21/2006  T:  10/22/2006  Job:  [id number]

## 2010-08-24 NOTE — Op Note (Signed)
[name], Kelly Richard                ACCOUNT NO.:  [id number]   MEDICAL RECORD NO.:  [id number]          PATIENT TYPE:  AMB   LOCATION:  DAY                           FACILITY:  [facility]   PHYSICIAN:  Tilda Burrow, M.D. DATE OF BIRTH:  10-24-1985   DATE OF PROCEDURE:  DATE OF DISCHARGE:                               OPERATIVE REPORT   PREOPERATIVE DIAGNOSES:  1. Elective sterilization.  2. Removal of Implanon intrauterine device.   POSTOPERATIVE DIAGNOSIS:  1. Elective sterilization.  2. Removal of Implanon intrauterine device.   PROCEDURES:  1. Laparoscopic tubal sterilization, Falope-Ring.  2. Removal of Implanon, left arm.   SURGEON:  Tilda Burrow, M.D.   ASSISTANT:  None.   ANESTHESIA:  General with endotracheal intubation.   COMPLICATIONS:  None.   FINDINGS:  Laparoscopic findings, normal tubes and ovaries.  Easily  accessible subcutaneous Implanon, left forearm.   DETAILS OF PROCEDURE:  The patient was taken to the operating room,  prepped and draped for a combined abdominal and vaginal procedure as  well as prepping and draping of the left forearm.  The Implanon had been  previously marked.  Time-out confirmed the procedure and the patient's  data.  The Implanon could be easily identified, and a transverse 8-mm  incision made across the distal tip of the Implanon, which was then  grasped with hemostat and extracted without difficulty.  Small amount of  oozing responded to the figure-of-eight suture and a Band-Aid.   The patient was taken to the operating room, prepped and draped in the  usual standard fashion with legs in low lithotomy leg supports after  general anesthesia was introduced without difficulty.  The bladder was  in-and-out catheterized and Hulka tenaculum attached to the cervix for  uterine manipulation.  An infraumbilical, vertical, 1-cm skin incision  was made as well as a transverse suprapubic 1-cm incision.  A Veress  needle was used to  achieve pneumoperitoneum through the umbilical  incision while being careful to orient the needle toward the pelvis  while elevating the abdominal wall by manual elevation.  Water droplet  test was used to confirm intraperitoneal placement.   Pneumoperitoneum was achieved easily under 8-to-10 mm of intra-abdominal  pressure; and the laparoscopic trocar was introduced, a 5-mm blunt  tipped trocar, under direct visualization using the video camera.  Peritoneal cavity was entered without difficulty.  Inspection of the  anterior surfaces of the abdominal contents showed no evidence of injury  or bleeding.  Attention was directed to the pelvis.  Findings were as  described above.   Attention was first directed to the left fallopian tube which was  elevated, identified to its fimbriated end and grasped in its midportion  with Falope ring applier.  Falope ring applied and then the tube  infiltrated with Marcaine solution 0.25% using a 22-gauge spinal needle  percutaneously applied.   Attention was then directed to the right fallopian tube where a similar  procedure was performed.  Photo documentation of the ring placements was  performed; 120 cc of saline was instilled into the abdomen; deflation of  CO2 performed; instruments removed and subcuticular 4-0 Dexon closure of  skin incisions performed.  The rest of the surgical instruments were  removed; Steri-Strips placed.  The patient allowed to awaken and go to  recovery room in standard fashion.      Tilda Burrow, M.D.  Electronically Signed     JVF/MEDQ  D:  07/30/2007  T:  07/31/2007  Job:  [id number]

## 2010-08-24 NOTE — Consult Note (Signed)
NAMEARPI, DIEBOLD                ACCOUNT NO.:  [id number]   MEDICAL RECORD NO.:  [id number]          PATIENT TYPE:  OIB   LOCATION:  A415                          FACILITY:  [facility]   PHYSICIAN:  Tilda Burrow, M.D. DATE OF BIRTH:  [birth year]   DATE OF CONSULTATION:  10/01/2006  DATE OF DISCHARGE:  10/01/2006                                 CONSULTATION   CHIEF COMPLAINT:  Lower abdominal and back pain, worse this weekend.   This is 25 year old gravida 3, para 3, AB1 with pregnancy complicated by  oxycodone snorting in the early part of the pregnancy which has been  corrected, and we are doing a controlled Percocet use at the present  time, recognizing we are not being le to get her off it during the  pregnancy.  She has been managed with Percocet a half tablet q.i.d.  initially, and then we ended up gradually increasing it as the pregnancy  has progressed.  At this point in time, she is examined here, and there  is absolutely nothing going on except the pressure of the baby on the  pelvis.  This is tolerated very poorly in this patient with a history of  narcotic addiction.  The plans are to keep her on the Percocet 5/325  with 45 tablets to last 2 weeks and follow up in our office on October 16, 2006.   Exam shows a nontender uterus.  Cervix was closed and high.  Thirty-six  centimeter fundal height with reactive NST criteria met.      Tilda Burrow, M.D.  Electronically Signed     JVF/MEDQ  D:  10/01/2006  T:  10/02/2006  Job:  [id number]

## 2010-08-24 NOTE — H&P (Signed)
Kelly Richard, Kelly Richard                ACCOUNT NO.:  [id number]   MEDICAL RECORD NO.:  [id number]          PATIENT TYPE:  INP   LOCATION:  9118                          FACILITY:  [facility]   PHYSICIAN:  Lazaro Arms, M.D.   DATE OF BIRTH:  27-Jan-1986   DATE OF ADMISSION:  10/25/2006  DATE OF DISCHARGE:  LH                              HISTORY & PHYSICAL   Kelly Richard is a 25 year old white female, gravida 3, para 1, abortus 1,  estimated date of delivery of October 29, 2006 currently at 39-5/[redacted] weeks  gestation who was in the office earlier today and was 3 cm dilated and  is coming in tonight complaining of regular uterine contractions with  some bloody show associated.  At the time of evaluation, she was 3-4 cm  and in early active labor.  There is a reactive NST.  She denies leakage  of fluid, rupture of membranes.   This pregnancy has been complicated by product abuse.  She was actually  snorting Tylox early in the pregnancy.  She was confronted with that and  came clean on that, and now she takes two Percocet a day.  Percocet  cannot be snorted because it is a tablet and not a capsule.  She  continued to smoke marijuana through the pregnancy but has diminished  that.  She is making good efforts towards trying to get weaned down on  all of her pain medicines.  She is hypothyroid, and she takes Synthroid.  Otherwise, her pregnancy has been uncomplicated.   PAST MEDICAL HISTORY:  1. Hypothyroidism.  2. Migraines.   PAST SURGICAL HISTORY:  Negative.   ALLERGIES:  PENICILLIN.   MEDICATIONS:  Percocet and Synthroid.   PAST OB HISTORY:  She had a vaginal delivery in 2006.  Pregnancy was  complicated by headaches which I think was really related to chronic  narcotic abuse.   REVIEW OF SYSTEMS:  Otherwise negative.   LABORATORY DATA:  Blood type is O negative.  RhoGAM was given.  Rubella  immune.  Hepatitis B negative.  HIV negative.  Serologies nonreactive.  Pap normal.  GC and chlamydia  negative.  AFP was normal.  Group B strep  returned as negative.  HSV was negative.   IMPRESSION:  1. Intrauterine pregnancy at 39-5/[redacted] weeks gestation.  2. Early active labor.  3. History of narcotic abuse.   PLAN:  The patient is admitted for management of labor.  Expectant NSVD.      Lazaro Arms, M.D.  Electronically Signed     LHE/MEDQ  D:  10/26/2006  T:  10/26/2006  Job:  [id number]

## 2010-08-24 NOTE — Consult Note (Signed)
NAMEMYRLE, WANEK                ACCOUNT NO.:  [id number]   MEDICAL RECORD NO.:  [id number]          PATIENT TYPE:  OIB   LOCATION:  LDR2                          FACILITY:  [facility]   PHYSICIAN:  Tilda Burrow, M.D. DATE OF BIRTH:  1986/02/22   DATE OF CONSULTATION:  09/12/2006  DATE OF DISCHARGE:                                 CONSULTATION   HISTORY:  Second exam is performed to rule out membrane rupture. She  presented last night complaining of suspected gush of fluid. Was  Nitrazine negative and was kept through the night just for ease of care  management.   This a.m. she is having no contractions, no bleeding and no continued  leakage. Speculum exam shows physiologic secretions that are Nitrazine  negative and Fern negative on microscopic exam. Cervix is closed, long,  high. Fetal monitoring will be used to confirm fetal wellbeing prior to  discharge. Routine followup.      Tilda Burrow, M.D.  Electronically Signed     JVF/MEDQ  D:  09/12/2006  T:  09/12/2006  Job:  [id number]   cc:   [facility]

## 2010-08-24 NOTE — H&P (Signed)
NAMESHELA, ESSES NO.:  [id number]   MEDICAL RECORD NO.:  [id number]          PATIENT TYPE:  OIB   LOCATION:  LDR1                          FACILITY:  [facility]   PHYSICIAN:  Tilda Burrow, M.D. DATE OF BIRTH:  01-02-1986   DATE OF ADMISSION:  08/23/2006  DATE OF DISCHARGE:  LH                              HISTORY & PHYSICAL   REASON FOR ADMISSION:  Pregnancy at 33 weeks with sharp, shooting pains  from mid back rotating down into the left lower quadrant that has been  going on since early a.m.   MEDICAL HISTORY:  Medical history is complicated by migraines,  hypothyroidism, and prescription drug use and abuse.   ALLERGIES:  PENICILLIN.   MEDICATIONS:  1. Tylenol.  2. Synthroid.  3. Prenatal vitamin.   FAMILY HISTORY:  Family history is positive for breast cancer.   PRENATAL COURSE:  Prenatal course has been complicated by recreational  drug use, sniffing oxycodone.  Blood toxs are positive, UDS is negative.  Rubella is equivocal.  Hepatitis B surface antigen is negative.  HIV is  negative.  Serologies are non-reactive.  Pap is normal.  __________ and  Chlamydia are negative.  AFB normal.   PHYSICAL EXAMINATION:  Vital signs are stable.  Fetal heart rate values  reactive, with accelerations negative.  There is no uterine contractions  noted on the external electronic fetal monitoring or by palpation.  Cervix is closed, firm and high.  Abdomen is gravid, nontender.   PLAN:  We are going to discharge home to followup.      Zerita Boers, Reita Cliche, M.D.     DL/MEDQ  D:  82/95/6213  T:  08/23/2006  Job:  [id number]   cc:   Tilda Burrow, M.D.  Fax: [phone]
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
## 2010-08-27 NOTE — H&P (Signed)
NAMECHOLE, DRIVER                ACCOUNT NO.:  [id number]   MEDICAL RECORD NO.:  [id number]          PATIENT TYPE:  INP   LOCATION:  LDR2                          FACILITY:  [facility]   PHYSICIAN:  Tilda Burrow, M.D. DATE OF BIRTH:  21-Jun-1985   DATE OF ADMISSION:  06/21/2004  DATE OF DISCHARGE:  LH                                HISTORY & PHYSICAL   REASON FOR ADMISSION:  Pregnancy at 40 weeks and 3 days, for induction of  labor.  After discussing risks and benefits of induction of labor with the  patient and family, patient and family are willing to accept those risks,  and desire elective induction of labor.   MEDICAL HISTORY:  1.  Positive for asthma.  2.  Abnormal Pap smear.   SURGICAL HISTORY:  Positive for cryosurgery on her cervix.   ALLERGIES:  PENICILLIN.   FAMILY HISTORY:  Positive for breast cancer in her mother.   PRENATAL COURSE:  Essentially uneventful, with the exception that she had a  bout with gastroesophageal reflux disease, and was placed on Prevacid, which  treated that with good response.  Blood type is O negative.  UDS is positive  for THC.  Rubella is immune.  Hepatitis B surface antigen negative.  HIV is  nonreactive.  HSB is negative.  Serology is nonreactive.  Pap normal.  GC  and Chlamydia are negative.  Twenty-eight week hemoglobin 12.9.  Twenty-  eight week hematocrit 38.8.  One hour glucose 105.  GBS is negative.  HSV-2  is negative.   PHYSICAL EXAMINATION TODAY:  VITAL SIGNS:  Weight is 210, blood pressure  130/62.  Fetal heart tones 130, strong and regular.  ABDOMEN:  Fundal height is 38 cm.  PELVIC:  Cervix is 1 cm, 75% effaced, anterior, soft, and vertex  presentation is noted.   PLAN:  We are going to admit for Foley bulb induction on June 21, 2004 at  5:30 a.m.      DL/MEDQ  D:  52/84/1324  T:  06/21/2004  Job:  [id number]

## 2010-08-27 NOTE — Op Note (Signed)
NAMEHARVEEN, FLESCH NO.:  [id number]   MEDICAL RECORD NO.:  [id number]          PATIENT TYPE:  INP   LOCATION:  LDR2                          FACILITY:  [facility]   PHYSICIAN:  Lazaro Arms, M.D.   DATE OF BIRTH:  [birth year]   DATE OF PROCEDURE:  06/22/2004  DATE OF DISCHARGE:                                 OPERATIVE REPORT   DELIVERY NOTE:  Carlie was completely comfortable with her epidural;  however, when I checked her she was noted to be at +3 station, so after  about a 30-minute second stage she delivered a viable female infant at 3.  The mouth and nose were suctioned on the perineum.  A compound left hand was  delivered and then the rest of the baby delivered without difficulty.  Weight was 7 pounds 4 ounces.  Apgars were 9 and 9.  Twenty units of Pitocin  dilated in 1000 mL of lactated Ringer's was then infused rapidly IV.  The  placenta separated and delivered via controlled cord traction at 1252.  It  was inspected and appears to be intact with a three-vessel cord.  Very  minimal blood loss was noted.  The vagina was inspected and no lacerations  were found.  The epidural catheter was removed with the blue tip visualized  as being intact.      FC/MEDQ  D:  06/22/2004  T:  06/22/2004  Job:  [id number]

## 2010-08-27 NOTE — Op Note (Signed)
Kelly Richard, Kelly Richard                ACCOUNT NO.:  [id number]   MEDICAL RECORD NO.:  [id number]          PATIENT TYPE:  INP   LOCATION:  A428                          FACILITY:  [facility]   PHYSICIAN:  Lazaro Arms, M.D.   DATE OF BIRTH:  [birth year]   DATE OF PROCEDURE:  06/22/2004  DATE OF DISCHARGE:  06/24/2004                                 OPERATIVE REPORT   PROCEDURE:  Epidural placement.   INDICATION:  Kelly Richard is in active phase of labor at 40-1/[redacted] weeks gestation.  She is requesting an epidural be placed.   DESCRIPTION OF PROCEDURE:  She was placed in a sitting position.  She is  prepped and draped in usual sterile fashion.  The L3-L4 interspace  identified and 1% lidocaine is injected.  A 17-gauge Tuohy needle is used  and loss of resistance technique employed.  The epidural space is found  without difficulty.  Then 10 cc of 0.125% bupivacaine plain is given as a  test dose without ill effects.  An additional 10 cc is given to dose up the  epidural, again without ill effects.  The epidural catheter is placed 5 cm  into the epidural space and takedown at that point.  A continuous infusion  of 0.125% bupivacaine with 2 mcg/cc fentanyl was begun at 12 cc/hr.  The  patient is getting comfortable.  Fetal heart rate tracing is reassuring and  the blood pressure stable.      LHE/MEDQ  D:  08/04/2004  T:  08/04/2004  Job:  [id number]

## 2011-01-04 LAB — HCG, QUANTITATIVE, PREGNANCY: hCG, Beta Chain, Quant, S: <2

## 2011-01-04 LAB — CBC
Platelets: 214
RDW: 13.3

## 2011-01-04 LAB — TSH: TSH: 64.41 — ABNORMAL HIGH

## 2011-01-06 LAB — CBC
Platelets: 213
RBC: 4.72
WBC: 9.8

## 2011-01-06 LAB — BASIC METABOLIC PANEL
BUN: 11
CO2: 26
Calcium: 9.4
Chloride: 106
Creatinine, Ser: 0.67
GFR calc Af Amer: >60
GFR calc non Af Amer: >60
Glucose, Bld: 103 — ABNORMAL HIGH
Potassium: 3.5
Sodium: 139

## 2011-01-06 LAB — DIFFERENTIAL
Basophils Relative: 0
Lymphs Abs: 1.4
Monocytes Relative: 6
Neutro Abs: 7.8 — ABNORMAL HIGH
Neutrophils Relative %: 80 — ABNORMAL HIGH

## 2011-01-24 LAB — CBC
HCT: 34.6 — ABNORMAL LOW
Hemoglobin: 11.7 — ABNORMAL LOW
Hemoglobin: 11.9 — ABNORMAL LOW
MCHC: 33.9
MCV: 90.7
MCV: 91.4
Platelets: 161
RBC: 3.87
WBC: 12.9 — ABNORMAL HIGH
WBC: 14 — ABNORMAL HIGH

## 2011-01-24 LAB — RH IMMUNE GLOB WKUP(>/=20WKS)(NOT WOMEN'S HOSP)

## 2011-01-24 LAB — RPR: RPR Ser Ql: NONREACTIVE

## 2011-01-26 LAB — URINALYSIS, ROUTINE W REFLEX MICROSCOPIC
Glucose, UA: NEGATIVE
Ketones, ur: NEGATIVE
Nitrite: NEGATIVE
Protein, ur: NEGATIVE
Urobilinogen, UA: 0.2

## 2011-10-03 ENCOUNTER — Encounter (HOSPITAL_COMMUNITY): Payer: Self-pay | Admitting: *Deleted

## 2011-10-03 ENCOUNTER — Emergency Department (HOSPITAL_COMMUNITY)
Admission: EM | Admit: 2011-10-03 | Discharge: 2011-10-04 | Disposition: A | Discharge status: Home / Self Care | Payer: Medicaid Other | Attending: Emergency Medicine | Admitting: Emergency Medicine

## 2011-10-03 DIAGNOSIS — F410 Panic disorder [episodic paroxysmal anxiety] without agoraphobia: Secondary | ICD-10-CM | POA: Insufficient documentation

## 2011-10-03 DIAGNOSIS — S39012A Strain of muscle, fascia and tendon of lower back, initial encounter: Secondary | ICD-10-CM

## 2011-10-03 DIAGNOSIS — S83411A Sprain of medial collateral ligament of right knee, initial encounter: Secondary | ICD-10-CM

## 2011-10-03 DIAGNOSIS — E079 Disorder of thyroid, unspecified: Secondary | ICD-10-CM | POA: Insufficient documentation

## 2011-10-03 DIAGNOSIS — M25569 Pain in unspecified knee: Secondary | ICD-10-CM | POA: Insufficient documentation

## 2011-10-03 DIAGNOSIS — S335XXA Sprain of ligaments of lumbar spine, initial encounter: Secondary | ICD-10-CM | POA: Insufficient documentation

## 2011-10-03 DIAGNOSIS — M5137 Other intervertebral disc degeneration, lumbosacral region: Secondary | ICD-10-CM | POA: Insufficient documentation

## 2011-10-03 DIAGNOSIS — F17200 Nicotine dependence, unspecified, uncomplicated: Secondary | ICD-10-CM | POA: Insufficient documentation

## 2011-10-03 DIAGNOSIS — S83419A Sprain of medial collateral ligament of unspecified knee, initial encounter: Secondary | ICD-10-CM | POA: Insufficient documentation

## 2011-10-03 DIAGNOSIS — M25529 Pain in unspecified elbow: Secondary | ICD-10-CM | POA: Insufficient documentation

## 2011-10-03 DIAGNOSIS — IMO0002 Reserved for concepts with insufficient information to code with codable children: Secondary | ICD-10-CM | POA: Insufficient documentation

## 2011-10-03 DIAGNOSIS — M51379 Other intervertebral disc degeneration, lumbosacral region without mention of lumbar back pain or lower extremity pain: Secondary | ICD-10-CM | POA: Insufficient documentation

## 2011-10-03 DIAGNOSIS — T07XXXA Unspecified multiple injuries, initial encounter: Secondary | ICD-10-CM

## 2011-10-03 HISTORY — DX: Reserved for concepts with insufficient information to code with codable children: IMO0002

## 2011-10-03 HISTORY — DX: Disorder of thyroid, unspecified: E07.9

## 2011-10-03 HISTORY — DX: Panic disorder (episodic paroxysmal anxiety): F41.0
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
## 2011-10-04 ENCOUNTER — Emergency Department (HOSPITAL_COMMUNITY): Payer: Medicaid Other

## 2011-10-04 MED ORDER — OXYCODONE-ACETAMINOPHEN 5-325 MG PO TABS: 1.0000 | ORAL_TABLET | ORAL | Status: AC | PRN

## 2011-10-04 MED ORDER — HYDROMORPHONE HCL PF 1 MG/ML IJ SOLN
1.0000 mg | Freq: Once | INTRAMUSCULAR | Status: AC
  Administered 2011-10-04: 1 mg via INTRAMUSCULAR
  Filled 2011-10-04: qty 1

## 2011-10-04 NOTE — Discharge Instructions (Signed)
Lumbosacral Strain Lumbosacral strain is one of the most common causes of back pain. There are many causes of back pain. Most are not serious conditions. CAUSES  Your backbone (spinal column) is made up of 24 main vertebral bodies, the sacrum, and the coccyx. These are held together by muscles and tough, fibrous tissue (ligaments). Nerve roots pass through the openings between the vertebrae. A sudden move or injury to the back may cause injury to, or pressure on, these nerves. This may result in localized back pain or pain movement (radiation) into the buttocks, down the leg, and into the foot. Sharp, shooting pain from the buttock down the back of the leg (sciatica) is frequently associated with a ruptured (herniated) disk. Pain may be caused by muscle spasm alone. Your caregiver can often find the cause of your pain by the details of your symptoms and an exam. In some cases, you may need tests (such as X-rays). Your caregiver will work with you to decide if any tests are needed based on your specific exam. HOME CARE INSTRUCTIONS   Avoid an underactive lifestyle. Active exercise, as directed by your caregiver, is your greatest weapon against back pain.   Avoid hard physical activities (tennis, racquetball, waterskiing) if you are not in proper physical condition for it. This may aggravate or create problems.   If you have a back problem, avoid sports requiring sudden body movements. Swimming and walking are generally safer activities.   Maintain good posture.   Avoid becoming overweight (obese).   Use bed rest for only the most extreme, sudden (acute) episode. Your caregiver will help you determine how much bed rest is necessary.   For acute conditions, you may put ice on the injured area.   Put ice in a plastic bag.   Place a towel between your skin and the bag.   Leave the ice on for 15 to 20 minutes at a time, every 2 hours, or as needed.   After you are improved and more active, it  may help to apply heat for 30 minutes before activities.  See your caregiver if you are having pain that lasts longer than expected. Your caregiver can advise appropriate exercises or therapy if needed. With conditioning, most back problems can be avoided. SEEK IMMEDIATE MEDICAL CARE IF:   You have numbness, tingling, weakness, or problems with the use of your arms or legs.   You experience severe back pain not relieved with medicines.   There is a change in bowel or bladder control.   You have increasing pain in any area of the body, including your belly (abdomen).   You notice shortness of breath, dizziness, or feel faint.   You feel sick to your stomach (nauseous), are throwing up (vomiting), or become sweaty.   You notice discoloration of your toes or legs, or your feet get very cold.   Your back pain is getting worse.   You have a fever.  MAKE SURE YOU:   Understand these instructions.   Will watch your condition.   Will get help right away if you are not doing well or get worse.  Document Released: 01/05/2005 Document Revised: 03/17/2011 Document Reviewed: 06/27/2008 [facility] Patient Information 2012 [place], [place].Knee Sprain You have a knee sprain. Sprains are painful injuries to the joints. A sprain is a partial or complete tearing of ligaments. Ligaments are tough, fibrous tissues that hold bones together at the joints. A strain (sprain) has occurred when a ligament is stretched or damaged.  This injury may take several weeks to heal. This is often the same length of time as a bone fracture (break in bone) takes to heal. Even though a fracture (bone break) may not have occurred, the recovery times may be similar. HOME CARE INSTRUCTIONS   Rest the injured area for as long as directed by your caregiver. Then slowly start using the joint as directed by your caregiver and as the pain allows. Use crutches as directed. If the knee was splinted or casted, continue use and care as  directed. If an ace bandage has been applied today, it should be removed and reapplied every 3 to 4 hours. It should not be applied tightly, but firmly enough to keep swelling down. Watch toes and feet for swelling, bluish discoloration, coldness, numbness or excessive pain. If any of these symptoms occur, remove the ace bandage and reapply more loosely.If these symptoms persist, seek medical attention.   For the first 24 hours, lie down. Keep the injured extremity elevated on two pillows.   Apply ice to the injured area for 15 to 20 minutes every couple hours. Repeat this 3 to 4 times per day for the first 48 hours. Put the ice in a plastic bag and place a towel between the bag of ice and your skin.   Wear any splinting, casting, or elastic bandage applications as instructed.   Only take over-the-counter or prescription medicines for pain, discomfort, or fever as directed by your caregiver. Do not use aspirin immediately after the injury unless instructed by your caregiver. Aspirin can cause increased bleeding and bruising of the tissues.   If you were given crutches, continue to use them as instructed. Do not resume weight bearing on the affected extremity until instructed.  Persistent pain and inability to use the injured area as directed for more than 2 to 3 days are warning signs. If this happens you should see a caregiver for a follow-up visit as soon as possible. Initially, a hairline fracture (this is the same as a broken bone) may not be evident on x-rays. Persistent pain and swelling indicate that further evaluation, non-weight bearing (use of crutches as instructed), and/or further x-rays are indicated. X-rays may sometimes not show a small fracture until a week or ten days later. Make a follow-up appointment with your own caregiver or one to whom we have referred you. A radiologist (specialist in reading x-rays) may re-read your X-rays. Make sure you know how you are to get your x-ray results.  Do not assume everything is normal if you do not hear from [place]. SEEK MEDICAL CARE IF:   Bruising, swelling, or pain increases.   You have cold or numb toes   You have continuing difficulty or pain with walking.  SEEK IMMEDIATE MEDICAL CARE IF:   Your toes are cold, numb or blue.   The pain is not responding to medications and continues to stay the same or get worse.  MAKE SURE YOU:   Understand these instructions.   Will watch your condition.   Will get help right away if you are not doing well or get worse.  Document Released: 03/28/2005 Document Revised: 03/17/2011 Document Reviewed: 03/12/2007 [facility] Patient Information 2012 [place], [place].   Use the medications prescribed for pain and inflammation. Do not drive within 4 hours of taking percocet as this will make you sleepy.  Call Dr Romeo Apple in 1 week if your pain persists,  Ice and elevate your knee,  Avoid weight bearing as much  as possible,  Using ace wrap for support.  Your xrays show no acute injury from your fall yesterday.
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
## 2011-10-06 NOTE — ED Provider Notes (Signed)
Medical screening examination/treatment/procedure(s) were performed by non-physician practitioner and as supervising physician I was immediately available for consultation/collaboration. Arion Shankles, MD, FACEP   Tinya Cadogan L Zuzu Befort, MD 10/06/11 0735 

## 2011-10-17 ENCOUNTER — Other Ambulatory Visit (HOSPITAL_COMMUNITY): Payer: Self-pay | Admitting: Physician Assistant

## 2011-10-17 DIAGNOSIS — M25569 Pain in unspecified knee: Secondary | ICD-10-CM

## 2011-10-17 DIAGNOSIS — IMO0002 Reserved for concepts with insufficient information to code with codable children: Secondary | ICD-10-CM

## 2011-10-19 ENCOUNTER — Ambulatory Visit (HOSPITAL_COMMUNITY): Payer: Medicaid Other

## 2011-10-26 ENCOUNTER — Ambulatory Visit (HOSPITAL_COMMUNITY): Admission: RE | Admit: 2011-10-26 | Payer: Medicaid Other | Source: Ambulatory Visit

## 2011-12-13 ENCOUNTER — Other Ambulatory Visit (HOSPITAL_COMMUNITY): Payer: Self-pay | Admitting: Physician Assistant

## 2011-12-15 ENCOUNTER — Ambulatory Visit (HOSPITAL_COMMUNITY): Payer: Medicaid Other

## 2011-12-19 ENCOUNTER — Ambulatory Visit (HOSPITAL_COMMUNITY)
Admission: RE | Admit: 2011-12-19 | Discharge: 2011-12-19 | Disposition: A | Discharge status: Home / Self Care | Payer: Medicaid Other | Source: Ambulatory Visit | Attending: Physician Assistant | Admitting: Physician Assistant

## 2011-12-19 DIAGNOSIS — M25569 Pain in unspecified knee: Secondary | ICD-10-CM | POA: Insufficient documentation

## 2011-12-19 DIAGNOSIS — IMO0002 Reserved for concepts with insufficient information to code with codable children: Secondary | ICD-10-CM

## 2011-12-19 DIAGNOSIS — R937 Abnormal findings on diagnostic imaging of other parts of musculoskeletal system: Secondary | ICD-10-CM | POA: Insufficient documentation

## 2012-01-23 ENCOUNTER — Ambulatory Visit (HOSPITAL_COMMUNITY)
Admission: RE | Admit: 2012-01-23 | Discharge: 2012-01-23 | Disposition: A | Discharge status: Home / Self Care | Payer: Medicaid Other | Source: Ambulatory Visit | Attending: Physician Assistant | Admitting: Physician Assistant

## 2012-01-23 ENCOUNTER — Other Ambulatory Visit (HOSPITAL_COMMUNITY): Payer: Self-pay | Admitting: Physician Assistant

## 2012-01-23 DIAGNOSIS — S93409A Sprain of unspecified ligament of unspecified ankle, initial encounter: Secondary | ICD-10-CM

## 2012-01-23 DIAGNOSIS — X58XXXA Exposure to other specified factors, initial encounter: Secondary | ICD-10-CM | POA: Insufficient documentation

## 2012-01-23 DIAGNOSIS — M25579 Pain in unspecified ankle and joints of unspecified foot: Secondary | ICD-10-CM | POA: Insufficient documentation

## 2012-01-28 ENCOUNTER — Emergency Department (HOSPITAL_COMMUNITY): Payer: Medicaid Other

## 2012-01-28 ENCOUNTER — Encounter (HOSPITAL_COMMUNITY): Payer: Self-pay | Admitting: *Deleted

## 2012-01-28 ENCOUNTER — Emergency Department (HOSPITAL_COMMUNITY)
Admission: EM | Admit: 2012-01-28 | Discharge: 2012-01-29 | Disposition: A | Discharge status: Home / Self Care | Payer: Medicaid Other | Attending: Emergency Medicine | Admitting: Emergency Medicine

## 2012-01-28 DIAGNOSIS — S93401A Sprain of unspecified ligament of right ankle, initial encounter: Secondary | ICD-10-CM

## 2012-01-28 DIAGNOSIS — S93409A Sprain of unspecified ligament of unspecified ankle, initial encounter: Secondary | ICD-10-CM | POA: Insufficient documentation

## 2012-01-28 DIAGNOSIS — Y92009 Unspecified place in unspecified non-institutional (private) residence as the place of occurrence of the external cause: Secondary | ICD-10-CM | POA: Insufficient documentation

## 2012-01-28 DIAGNOSIS — E079 Disorder of thyroid, unspecified: Secondary | ICD-10-CM | POA: Insufficient documentation

## 2012-01-28 DIAGNOSIS — X500XXA Overexertion from strenuous movement or load, initial encounter: Secondary | ICD-10-CM | POA: Insufficient documentation
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
## 2012-01-29 NOTE — ED Notes (Signed)
Pt discharged. Pt stable at time of discharge. Pt has no questions regarding discharge at this time. Pt voiced understanding of discharge instructions.  

## 2012-01-29 NOTE — ED Provider Notes (Signed)
Medical screening examination/treatment/procedure(s) were performed by non-physician practitioner and as supervising physician I was immediately available for consultation/collaboration. Devoria Albe, MD, Armando Gang   Ward Givens, MD 01/29/12 [phone]

## 2012-02-07 ENCOUNTER — Emergency Department (HOSPITAL_COMMUNITY)
Admission: EM | Admit: 2012-02-07 | Discharge: 2012-02-07 | Disposition: A | Discharge status: Home / Self Care | Payer: Medicaid Other | Attending: Emergency Medicine | Admitting: Emergency Medicine

## 2012-02-07 ENCOUNTER — Encounter (HOSPITAL_COMMUNITY): Payer: Self-pay | Admitting: *Deleted

## 2012-02-07 DIAGNOSIS — G43909 Migraine, unspecified, not intractable, without status migrainosus: Secondary | ICD-10-CM | POA: Insufficient documentation

## 2012-02-07 DIAGNOSIS — F17200 Nicotine dependence, unspecified, uncomplicated: Secondary | ICD-10-CM | POA: Insufficient documentation

## 2012-02-07 DIAGNOSIS — Z8739 Personal history of other diseases of the musculoskeletal system and connective tissue: Secondary | ICD-10-CM | POA: Insufficient documentation

## 2012-02-07 DIAGNOSIS — Z8659 Personal history of other mental and behavioral disorders: Secondary | ICD-10-CM | POA: Insufficient documentation

## 2012-02-07 DIAGNOSIS — R51 Headache: Secondary | ICD-10-CM

## 2012-02-07 DIAGNOSIS — E079 Disorder of thyroid, unspecified: Secondary | ICD-10-CM | POA: Insufficient documentation

## 2012-02-07 MED ORDER — KETOROLAC TROMETHAMINE 60 MG/2ML IM SOLN
60.0000 mg | Freq: Once | INTRAMUSCULAR | Status: AC
  Administered 2012-02-07: 60 mg via INTRAMUSCULAR
  Filled 2012-02-07: qty 2

## 2012-02-07 MED ORDER — HYDROMORPHONE HCL PF 1 MG/ML IJ SOLN
1.0000 mg | Freq: Once | INTRAMUSCULAR | Status: AC
  Administered 2012-02-07: 1 mg via INTRAMUSCULAR
  Filled 2012-02-07: qty 1

## 2012-02-07 MED ORDER — METOCLOPRAMIDE HCL 5 MG/ML IJ SOLN
10.0000 mg | Freq: Once | INTRAMUSCULAR | Status: AC
  Administered 2012-02-07: 10 mg via INTRAMUSCULAR
  Filled 2012-02-07: qty 2

## 2012-02-07 MED ORDER — ONDANSETRON 8 MG PO TBDP
8.0000 mg | ORAL_TABLET | Freq: Once | ORAL | Status: AC
  Administered 2012-02-07: 8 mg via ORAL
  Filled 2012-02-07: qty 1

## 2012-02-07 NOTE — ED Notes (Signed)
Pt discharged. Pt stable at time of discharge. Medications reviewed pt has no questions regarding discharge at this time. Pt voiced understanding of discharge instructions.  

## 2012-02-07 NOTE — ED Notes (Signed)
MD at bedside. 

## 2012-02-07 NOTE — ED Provider Notes (Signed)
History     CSN: [id number]  Arrival date & time 02/07/12  0134   First MD Initiated Contact with Patient 02/07/12 0144      Chief Complaint  Patient presents with  . Migraine    (Consider location/radiation/quality/duration/timing/severity/associated sxs/prior treatment) HPI  Kelly Richard is a 26 y.o. female  With a h/o migraines who presents to the Emergency Department complaining of headache present for 4 days. She has taken her home medicines with no relief. Headache is similar to previous headaches and is brought on by stress. She currently is under a lot of stress; grandfather dying is Hospice, living with friends waiting for her house to be finished, raising children on her own. She denies vision changes, stiff neck, difficulty speaking or swallowing, extremity weakness, dizziness.   PCP Dr. Regino Schultze  Past Medical History  Diagnosis Date  . Degenerative disc disease   . Thyroid disease   . Panic attacks     Past Surgical History  Procedure Date  . Tubal ligation     History reviewed. No pertinent family history.  History  Substance Use Topics  . Smoking status: Current Every Day Smoker  . Smokeless tobacco: Not on file  . Alcohol Use: Yes    OB History    Grav Para Term Preterm Abortions TAB SAB Ect Mult Living                  Review of Systems  Constitutional: Negative for fever.       10 Systems reviewed and are negative for acute change except as noted in the HPI.  HENT: Negative for congestion.   Eyes: Negative for discharge and redness.  Respiratory: Negative for cough and shortness of breath.   Cardiovascular: Negative for chest pain.  Gastrointestinal: Negative for vomiting and abdominal pain.  Musculoskeletal: Negative for back pain.  Skin: Negative for rash.  Neurological: Positive for headaches. Negative for syncope and numbness.  Psychiatric/Behavioral:       No behavior change.    Allergies  Amoxicillin and Penicillins  Home  Medications  No current outpatient prescriptions on file.  BP 114/72  Pulse 84  Temp 98.1 F (36.7 C) (Oral)  Resp 20  Ht 5' 7.75" (1.721 m)  Wt 165 lb (74.844 kg)  BMI 25.27 kg/m2  SpO2 99%  LMP 01/14/2012  Physical Exam  Nursing note and vitals reviewed. Constitutional: She is oriented to person, place, and time. She appears well-developed and well-nourished. No distress.       Awake, alert, nontoxic appearance.  HENT:  Head: Atraumatic.  Eyes: Right eye exhibits no discharge. Left eye exhibits no discharge.  Neck: Neck supple.  Cardiovascular: Normal heart sounds.   Pulmonary/Chest: Effort normal and breath sounds normal. She exhibits no tenderness.  Abdominal: Soft. There is no tenderness. There is no rebound.  Musculoskeletal: She exhibits no tenderness.       Baseline ROM, no obvious new focal weakness.  Neurological: She is alert and oriented to person, place, and time. No cranial nerve deficit. Coordination normal.       Mental status and motor strength appears baseline for patient and situation.No facial asymmetry, speech normal  Skin: No rash noted.  Psychiatric: She has a normal mood and affect.    ED Course  Procedures (including critical care time)    0310 Patient states headache is now 2/10. She is ready for discharge. Calling her ride home.  MDM  Patient with h/o stress induced headaches here with  headache x 4 days. Given analgesic, antiemetic, antiinflammatory and reglan with improvement.  Pt feels improved after observation and/or treatment in ED.Pt stable in ED with no significant deterioration in condition.The patient appears reasonably screened and/or stabilized for discharge and I doubt any other medical condition or other [facility] requiring further screening, evaluation, or treatment in the ED at this time prior to discharge.  MDM Reviewed: nursing note and vitals           Nicoletta Dress. Colon Branch, MD 02/07/12 [phone]

## 2012-02-07 NOTE — ED Notes (Signed)
Pt c/o severe headache. Pt states she has had migraine for 4 days.

## 2012-02-07 NOTE — ED Notes (Signed)
Pt stated pain starts in the back of her head and shoots forward toward the front of her face.

## 2012-03-07 ENCOUNTER — Ambulatory Visit: Payer: Medicaid Other | Admitting: Orthopedic Surgery

## 2012-03-22 ENCOUNTER — Ambulatory Visit (INDEPENDENT_AMBULATORY_CARE_PROVIDER_SITE_OTHER): Payer: Medicaid Other | Admitting: Orthopedic Surgery

## 2012-03-22 ENCOUNTER — Encounter: Payer: Self-pay | Admitting: Orthopedic Surgery

## 2012-03-22 VITALS — BP 110/72 | Ht 67.0 in | Wt 174.0 lb

## 2012-03-22 DIAGNOSIS — S93409A Sprain of unspecified ligament of unspecified ankle, initial encounter: Secondary | ICD-10-CM

## 2012-03-22 MED ORDER — HYDROCODONE-ACETAMINOPHEN 5-325 MG PO TABS: 1.0000 | ORAL_TABLET | Freq: Four times a day (QID) | ORAL | Status: DC | PRN

## 2012-03-22 NOTE — Progress Notes (Signed)
Patient ID: Kelly Richard, female   DOB: 04-16-1985, 26 y.o.   MRN: [id number] Chief Complaint  Patient presents with  . Ankle Pain    Right ankle and foot pain after injury 01/21/12    Referral from [facility]  The patient injured her RIGHT ankle falling down some stairs back in October approximately October 12. She was treated with a boot after x-rays were negative. She did not improve after taking naproxen. She also took some oxycodone 7.5 and 15 mg until she ran out.  She complains of throbbing, stabbing, lateral ankle pain 8/10 in intensity. It is constant. It is worse with walking associated with bruising and swelling, and it is primarily her long the lateral ankle joint. The fibula and anterior talofibular ligament.  Review of systems joint pain and swelling, anxiety, and otherwise, normal  Physical Exam(12)  Vital signs: BP 110/72  Ht 5\' 7"  (1.702 m)  Wt 174 lb (78.926 kg)  BMI 27.25 kg/m2   GENERAL: normal development   CDV: pulses are normal   Skin: normal  Lymph: nodes were not palpable/normal  Psychiatric: awake, alert and oriented  Neuro: normal sensation  MSK  Gait: Ambulates fairly normally in cowboy boots 1 Inspection , tenderness over the lateral Ligament complex of the ankle. No bony tenderness discoloration noted on the skin. Range of motion passively normal. Weakness of fevers. Stability normal firm drawer test.  Imaging Report and image reviewed show no fracture, and normal mortise.  Assessment: Chronic ankle sprain unresolved    Plan: Recommend physical therapy, Cam Walker as needed. If no improvement after 6 weeks. Call office back to schedule MRI should meet Medicare requirements by that time.  Continue with hydrocodone for pain for refills as well.

## 2012-03-22 NOTE — Patient Instructions (Addendum)
PT at hospital   Acute Ankle Sprain with Phase I Rehab An acute ankle sprain is a partial or complete tear in one or more of the ligaments of the ankle due to traumatic injury. The severity of the injury depends on both the the number of ligaments sprained and the grade of sprain. There are 3 grades of sprains.    A grade 1 sprain is a mild sprain. There is a slight pull without obvious tearing. There is no loss of strength, and the muscle and ligament are the correct length.   A grade 2 sprain is a moderate sprain. There is tearing of fibers within the substance of the ligament where it connects two bones or two cartilages. The length of the ligament is increased, and there is usually decreased strength.   A grade 3 sprain is a complete rupture of the ligament and is uncommon.  In addition to the grade of sprain, there are three types of ankle sprains.   Lateral ankle sprains: This is a sprain of one or more of the three ligaments on the outer side (lateral) of the ankle. These are the most common sprains. Medial ankle sprains: There is one large triangular ligament of the inner side (medial) of the ankle that is susceptible to injury. Medial ankle sprains are less common. Syndesmosis, "high ankle," sprains: The syndesmosis is the ligament that connects the two bones of the lower leg. Syndesmosis sprains usually only occur with very severe ankle sprains. SYMPTOMS  Pain, tenderness, and swelling in the ankle, starting at the side of injury that may progress to the whole ankle and foot with time.   "Pop" or tearing sensation at the time of injury.   Bruising that may spread to the heel.   Impaired ability to walk soon after injury.  CAUSES    Acute ankle sprains are caused by trauma placed on the ankle that temporarily forces or pries the anklebone (talus) out of its normal socket.   Stretching or tearing of the ligaments that normally hold the joint in place (usually due to a twisting  injury).  RISK INCREASES WITH:  Previous ankle sprain.   Sports in which the foot may land awkwardly (ie. basketball, volleyball, or soccer) or walking or running on uneven or rough surfaces.   Shoes with inadequate support to prevent sideways motion when stress occurs.   Poor strength and flexibility.   Poor balance skills.   Contact sports.  PREVENTION    Warm up and stretch properly before activity.   Maintain physical fitness:   Ankle and leg flexibility, muscle strength, and endurance.   Cardiovascular fitness.   Balance training activities.   Use proper technique and have a coach correct improper technique.   Taping, protective strapping, bracing, or high-top tennis shoes may help prevent injury. Initially, tape is best; however, it loses most of its support function within 10 to 15 minutes.   Wear proper fitted protective shoes (High-top shoes with taping or bracing is more effective than either alone).   Provide the ankle with support during sports and practice activities for 12 months following injury.  PROGNOSIS    If treated properly, ankle sprains can be expected to recover completely; however, the length of recovery depends on the degree of injury.   A grade 1 sprain usually heals enough in 5 to 7 days to allow modified activity and requires an average of 6 weeks to heal completely.   A grade 2 sprain requires 6 to  10 weeks to heal completely.   A grade 3 sprain requires 12 to 16 weeks to heal.   A syndesmosis sprain often takes more than 3 months to heal.  RELATED COMPLICATIONS    Frequent recurrence of symptoms may result in a chronic problem. Appropriately addressing the problem the first time decreases the frequency of recurrence and optimizes healing time. Severity of the initial sprain does not predict the likelihood of later instability.   Injury to other structures (bone, cartilage, or tendon).   A chronically unstable or arthritic ankle joint is  a possiblity with repeated sprains.  TREATMENT Treatment initially involves the use of ice, medication, and compression bandages to help reduce pain and inflammation. Ankle sprains are usually immobilized in a walking cast or boot to allow for healing. Crutches may be recommended to reduce pressure on the injury. After immobilization, strengthening and stretching exercises may be necessary to regain strength and a full range of motion. Surgery is rarely needed to treat ankle sprains. MEDICATION    Nonsteroidal anti-inflammatory medications, such as aspirin and ibuprofen (do not take for the first 3 days after injury or within 7 days before surgery), or other minor pain relievers, such as acetaminophen, are often recommended. Take these as directed by your caregiver. Contact your caregiver immediately if any bleeding, stomach upset, or signs of an allergic reaction occur from these medications.   Ointments applied to the skin may be helpful.   Pain relievers may be prescribed as necessary by your caregiver. Do not take prescription pain medication for longer than 4 to 7 days. Use only as directed and only as much as you need.  HEAT AND COLD  Cold treatment (icing) is used to relieve pain and reduce inflammation for acute and chronic cases. Cold should be applied for 10 to 15 minutes every 2 to 3 hours for inflammation and pain and immediately after any activity that aggravates your symptoms. Use ice packs or an ice massage.   Heat treatment may be used before performing stretching and strengthening activities prescribed by your caregiver. Use a heat pack or a warm soak.  SEEK IMMEDIATE MEDICAL CARE IF:    Pain, swelling, or bruising worsens despite treatment.   You experience pain, numbness, discoloration, or coldness in the foot or toes.   New, unexplained symptoms develop (drugs used in treatment may produce side effects.)  EXERCISES   PHASE I EXERCISES RANGE OF MOTION (ROM) AND STRETCHING  EXERCISES - Ankle Sprain, Acute Phase I, Weeks 1 to 2 These exercises may help you when beginning to restore flexibility in your ankle. You will likely work on these exercises for the 1 to 2 weeks after your injury. Once your physician, physical therapist, or athletic trainer sees adequate progress, he or she will advance your exercises. While completing these exercises, remember:    Restoring tissue flexibility helps normal motion to return to the joints. This allows healthier, less painful movement and activity.   An effective stretch should be held for at least 30 seconds.   A stretch should never be painful. You should only feel a gentle lengthening or release in the stretched tissue.  RANGE OF MOTION - Dorsi/Plantar Flexion  While sitting with your right / left knee straight, draw the top of your foot upwards by flexing your ankle. Then reverse the motion, pointing your toes downward.   Hold each position for __________ seconds.   After completing your first set of exercises, repeat this exercise with your knee bent.  Repeat __________ times. Complete this exercise __________ times per day.   RANGE OF MOTION - Ankle Alphabet  Imagine your right / left big toe is a pen.   Keeping your hip and knee still, write out the entire alphabet with your "pen." Make the letters as large as you can without increasing any discomfort.  Repeat __________ times. Complete this exercise __________ times per day.   STRENGTHENING EXERCISES - Ankle Sprain, Acute -Phase I, Weeks 1 to 2 These exercises may help you when beginning to restore strength in your ankle. You will likely work on these exercises for 1 to 2 weeks after your injury. Once your physician, physical therapist, or athletic trainer sees adequate progress, he or she will advance your exercises. While completing these exercises, remember:    Muscles can gain both the endurance and the strength needed for everyday activities through controlled  exercises.   Complete these exercises as instructed by your physician, physical therapist, or athletic trainer. Progress the resistance and repetitions only as guided.   You may experience muscle soreness or fatigue, but the pain or discomfort you are trying to eliminate should never worsen during these exercises. If this pain does worsen, stop and make certain you are following the directions exactly. If the pain is still present after adjustments, discontinue the exercise until you can discuss the trouble with your clinician.  STRENGTH - Dorsiflexors  Secure a rubber exercise band/tubing to a fixed object (ie. table, pole) and loop the other end around your right / left foot.   Sit on the floor facing the fixed object. The band/tubing should be slightly tense when your foot is relaxed.   Slowly draw your foot back toward you using your ankle and toes.   Hold this position for __________ seconds. Slowly release the tension in the band and return your foot to the starting position.  Repeat __________ times. Complete this exercise __________ times per day.   STRENGTH - Plantar-flexors   Sit with your right / left leg extended. Holding onto both ends of a rubber exercise band/tubing, loop it around the ball of your foot. Keep a slight tension in the band.   Slowly push your toes away from you, pointing them downward.   Hold this position for __________ seconds. Return slowly, controlling the tension in the band/tubing.  Repeat __________ times. Complete this exercise __________ times per day.   STRENGTH - Ankle Eversion  Secure one end of a rubber exercise band/tubing to a fixed object (table, pole). Loop the other end around your foot just before your toes.   Place your fists between your knees. This will focus your strengthening at your ankle.   Drawing the band/tubing across your opposite foot, slowly, pull your little toe out and up. Make sure the band/tubing is positioned to resist  the entire motion.   Hold this position for __________ seconds.  Have your muscles resist the band/tubing as it slowly pulls your foot back to the starting position.   Repeat __________ times. Complete this exercise __________ times per day.   STRENGTH - Ankle Inversion  Secure one end of a rubber exercise band/tubing to a fixed object (table, pole). Loop the other end around your foot just before your toes.   Place your fists between your knees. This will focus your strengthening at your ankle.   Slowly, pull your big toe up and in, making sure the band/tubing is positioned to resist the entire motion.   Hold this position  for __________ seconds.   Have your muscles resist the band/tubing as it slowly pulls your foot back to the starting position.  Repeat __________ times. Complete this exercises __________ times per day.   STRENGTH - Towel Curls  Sit in a chair positioned on a non-carpeted surface.   Place your right / left foot on a towel, keeping your heel on the floor.   Pull the towel toward your heel by only curling your toes. Keep your heel on the floor.   If instructed by your physician, physical therapist, or athletic trainer, add weight to the end of the towel.  Repeat __________ times. Complete this exercise __________ times per day. Document Released: 10/27/2004 Document Revised: 06/20/2011 Document Reviewed: 07/10/2008 [facility] Patient Information 2013 [place], [place].

## 2012-03-29 ENCOUNTER — Telehealth: Payer: Self-pay | Admitting: Orthopedic Surgery

## 2012-03-29 NOTE — Telephone Encounter (Signed)
Called patient to follow up on whether she has decided to have her therapy done at [facility] or at one of the other [place] facilities. States she has been sick with the flu and has been in bed since her visit here 03/22/12, and said that she will follow up with them about scheduling appointment.  Aware we are faxing orders there today.

## 2012-04-14 ENCOUNTER — Encounter (HOSPITAL_COMMUNITY): Payer: Self-pay | Admitting: *Deleted

## 2012-04-14 ENCOUNTER — Emergency Department (HOSPITAL_COMMUNITY)
Admission: EM | Admit: 2012-04-14 | Discharge: 2012-04-15 | Disposition: A | Discharge status: Home / Self Care | Payer: Medicaid Other | Attending: Emergency Medicine | Admitting: Emergency Medicine

## 2012-04-14 DIAGNOSIS — M542 Cervicalgia: Secondary | ICD-10-CM | POA: Insufficient documentation

## 2012-04-14 DIAGNOSIS — F17200 Nicotine dependence, unspecified, uncomplicated: Secondary | ICD-10-CM | POA: Insufficient documentation

## 2012-04-14 DIAGNOSIS — Z8659 Personal history of other mental and behavioral disorders: Secondary | ICD-10-CM | POA: Insufficient documentation

## 2012-04-14 DIAGNOSIS — H53149 Visual discomfort, unspecified: Secondary | ICD-10-CM | POA: Insufficient documentation

## 2012-04-14 DIAGNOSIS — G43909 Migraine, unspecified, not intractable, without status migrainosus: Secondary | ICD-10-CM | POA: Insufficient documentation

## 2012-04-14 DIAGNOSIS — Z79899 Other long term (current) drug therapy: Secondary | ICD-10-CM | POA: Insufficient documentation

## 2012-04-14 DIAGNOSIS — E079 Disorder of thyroid, unspecified: Secondary | ICD-10-CM | POA: Insufficient documentation

## 2012-04-14 DIAGNOSIS — R630 Anorexia: Secondary | ICD-10-CM | POA: Insufficient documentation

## 2012-04-14 DIAGNOSIS — M549 Dorsalgia, unspecified: Secondary | ICD-10-CM | POA: Insufficient documentation

## 2012-04-14 DIAGNOSIS — Z8739 Personal history of other diseases of the musculoskeletal system and connective tissue: Secondary | ICD-10-CM | POA: Insufficient documentation

## 2012-04-14 DIAGNOSIS — R112 Nausea with vomiting, unspecified: Secondary | ICD-10-CM | POA: Insufficient documentation

## 2012-04-14 DIAGNOSIS — R209 Unspecified disturbances of skin sensation: Secondary | ICD-10-CM | POA: Insufficient documentation

## 2012-04-14 MED ORDER — SODIUM CHLORIDE 0.9 % IV BOLUS (SEPSIS)
1000.0000 mL | Freq: Once | INTRAVENOUS | Status: AC
  Administered 2012-04-14: 1000 mL via INTRAVENOUS

## 2012-04-14 MED ORDER — PROMETHAZINE HCL 25 MG/ML IJ SOLN
12.5000 mg | Freq: Once | INTRAMUSCULAR | Status: AC
  Administered 2012-04-14: 12.5 mg via INTRAVENOUS
  Filled 2012-04-14: qty 1

## 2012-04-14 MED ORDER — DEXAMETHASONE SODIUM PHOSPHATE 4 MG/ML IJ SOLN
10.0000 mg | Freq: Once | INTRAMUSCULAR | Status: AC
  Administered 2012-04-14: 10 mg via INTRAVENOUS
  Filled 2012-04-14: qty 3

## 2012-04-14 MED ORDER — DIPHENHYDRAMINE HCL 50 MG/ML IJ SOLN
25.0000 mg | Freq: Once | INTRAMUSCULAR | Status: AC
  Administered 2012-04-14: 25 mg via INTRAVENOUS
  Filled 2012-04-14: qty 1

## 2012-04-14 MED ORDER — SODIUM CHLORIDE 0.9 % IV SOLN
INTRAVENOUS | Status: DC
  Administered 2012-04-14: via INTRAVENOUS

## 2012-04-14 NOTE — ED Notes (Signed)
Pt c/o pins and needles to right hand. Pt also c/o migraine headache.

## 2012-04-14 NOTE — ED Provider Notes (Signed)
History   This chart was scribed for [name], MD by Leone Payor, ED Scribe. This patient was seen in room APA03/APA03 and the patient's care was started at 2151.   CSN: [id number]  Arrival date & time 04/14/12  2111   First MD Initiated Contact with Patient 04/14/12 2151      Chief Complaint  Patient presents with  . Migraine  . Numbness     Patient is a 27 y.o. female presenting with migraines. The history is provided by the patient. No language interpreter was used.  Migraine This is a chronic problem. The current episode started more than 2 days ago. The problem occurs constantly. The problem has not changed since onset.Associated symptoms include headaches. Pertinent negatives include no chest pain, no abdominal pain and no shortness of breath. Nothing relieves the symptoms.    Kelly Richard is a 27 y.o. female who presents to the Emergency Department complaining of a new, unchanged, moderate migraine to the back of head that radiates to the back of neck that started 5 days ago. Pt reports taking hydrocodone with no relief. Pt has associated nausea, vomiting and slight photophobia. She rates the pain as 7/10.   Pt also complains of numbness and tingling in the right arm from the elbow down to the little and ring fingers starting about 1 week ago.   No PCP.  Pt has h/o degenerative disc disease.  Pt is a current everyday smoker but denies alcohol use.  Past Medical History  Diagnosis Date  . Degenerative disc disease   . Thyroid disease   . Panic attacks     Past Surgical History  Procedure Date  . Tubal ligation     History reviewed. No pertinent family history.  History  Substance Use Topics  . Smoking status: Current Every Day Smoker -- 1.0 packs/day  . Smokeless tobacco: Not on file  . Alcohol Use: No    OB History    Grav Para Term Preterm Abortions TAB SAB Ect Mult Living                  Review of Systems  Constitutional: Positive for  appetite change. Negative for fever.  HENT: Positive for neck pain. Negative for congestion, sore throat and rhinorrhea.   Eyes: Positive for photophobia.  Respiratory: Negative for cough and shortness of breath.   Cardiovascular: Negative for chest pain.  Gastrointestinal: Positive for nausea and vomiting. Negative for abdominal pain and diarrhea.  Genitourinary: Negative for dysuria and hematuria.  Musculoskeletal: Positive for back pain. Negative for myalgias.  Skin: Negative for rash.  Neurological: Positive for numbness and headaches.  Hematological: Does not bruise/bleed easily.  All other systems reviewed and are negative.    Allergies  Amoxicillin and Penicillins  Home Medications   Current Outpatient Rx  Name  Route  Sig  Dispense  Refill  . HYDROCODONE-ACETAMINOPHEN 5-325 MG PO TABS   Oral   Take 1 tablet by mouth every 6 (six) hours as needed for pain.   42 tablet   4   . LEVOTHYROXINE SODIUM 175 MCG PO TABS   Oral   Take 175 mcg by mouth daily.           BP 125/78  Pulse 71  Temp 98.1 F (36.7 C) (Oral)  Resp 18  Ht 5' 7.75" (1.721 m)  Wt 175 lb (79.379 kg)  BMI 26.81 kg/m2  SpO2 99%  LMP 04/04/2012  Physical Exam  Nursing note  and vitals reviewed. Constitutional: She is oriented to person, place, and time. She appears well-developed and well-nourished. No distress.  HENT:  Head: Normocephalic and atraumatic.  Mouth/Throat: Oropharynx is clear and moist.  Eyes: EOM are normal.  Neck: Neck supple. No tracheal deviation present.  Cardiovascular: Normal rate, regular rhythm and normal heart sounds.   Pulmonary/Chest: Effort normal and breath sounds normal. No respiratory distress.  Abdominal: Soft. Bowel sounds are normal. There is no tenderness.  Musculoskeletal: Normal range of motion.       No tightness to the back of the neck.   Lymphadenopathy:    She has no cervical adenopathy.  Neurological: She is alert and oriented to person, place, and  time. No cranial nerve deficit. She exhibits normal muscle tone. Coordination normal.       Pt able to move both sets of fingers and toes   Skin: Skin is warm and dry.  Psychiatric: She has a normal mood and affect. Her behavior is normal.    ED Course  Procedures (including critical care time) DIAGNOSTIC STUDIES: Oxygen Saturation is 99% on room air, normal by my interpretation.    COORDINATION OF CARE:   10:15 PM Discussed treatment plan which includes migraine cocktail with pt at bedside and pt agreed to plan.   Labs Reviewed - No data to display No results found.   1. Migraine       MDM  Patient presents with posterior headache typical for her migraines. Also with a complaint of some numbness to her right last 2 fingers and hand. This may result represent an ulnar nerve irritation. However motor strength is intact this was present long before the migraine started. No direct injury to that area recently.  Patient treated with migraine cocktail in the emergency department Decadron Benadryl and Phenergan and 1 L of IV fluids. Patient states that the headache is improving but not resolved. Patient is stable for discharge home nontoxic no acute distress.    I personally performed the services described in this documentation, which was scribed in my presence. The recorded information has been reviewed and is accurate.      [name], MD 04/15/12 Marlyne Beards

## 2012-04-15 NOTE — ED Notes (Signed)
Pt discharged. Pt stable at time of discharge. pt has no questions regarding discharge at this time. Pt voiced understanding of discharge instructions.  

## 2012-04-28 ENCOUNTER — Emergency Department (HOSPITAL_COMMUNITY)
Admission: EM | Admit: 2012-04-28 | Discharge: 2012-04-29 | Disposition: A | Discharge status: Home / Self Care | Payer: Medicaid Other | Attending: Emergency Medicine | Admitting: Emergency Medicine

## 2012-04-28 ENCOUNTER — Encounter (HOSPITAL_COMMUNITY): Payer: Self-pay | Admitting: *Deleted

## 2012-04-28 ENCOUNTER — Emergency Department (HOSPITAL_COMMUNITY): Payer: Medicaid Other

## 2012-04-28 DIAGNOSIS — S298XXA Other specified injuries of thorax, initial encounter: Secondary | ICD-10-CM | POA: Insufficient documentation

## 2012-04-28 DIAGNOSIS — R450 Nervousness: Secondary | ICD-10-CM | POA: Insufficient documentation

## 2012-04-28 DIAGNOSIS — Y9389 Activity, other specified: Secondary | ICD-10-CM | POA: Insufficient documentation

## 2012-04-28 DIAGNOSIS — R0789 Other chest pain: Secondary | ICD-10-CM

## 2012-04-28 DIAGNOSIS — W1809XA Striking against other object with subsequent fall, initial encounter: Secondary | ICD-10-CM | POA: Insufficient documentation

## 2012-04-28 DIAGNOSIS — F17200 Nicotine dependence, unspecified, uncomplicated: Secondary | ICD-10-CM | POA: Insufficient documentation

## 2012-04-28 DIAGNOSIS — Y929 Unspecified place or not applicable: Secondary | ICD-10-CM | POA: Insufficient documentation

## 2012-04-28 DIAGNOSIS — F411 Generalized anxiety disorder: Secondary | ICD-10-CM | POA: Insufficient documentation

## 2012-04-28 DIAGNOSIS — E079 Disorder of thyroid, unspecified: Secondary | ICD-10-CM | POA: Insufficient documentation

## 2012-04-28 DIAGNOSIS — Z8739 Personal history of other diseases of the musculoskeletal system and connective tissue: Secondary | ICD-10-CM | POA: Insufficient documentation

## 2012-04-28 DIAGNOSIS — IMO0002 Reserved for concepts with insufficient information to code with codable children: Secondary | ICD-10-CM | POA: Insufficient documentation

## 2012-04-28 DIAGNOSIS — Z79899 Other long term (current) drug therapy: Secondary | ICD-10-CM | POA: Insufficient documentation

## 2012-04-28 DIAGNOSIS — Z8659 Personal history of other mental and behavioral disorders: Secondary | ICD-10-CM | POA: Insufficient documentation

## 2012-04-28 DIAGNOSIS — S0990XA Unspecified injury of head, initial encounter: Secondary | ICD-10-CM | POA: Insufficient documentation

## 2012-04-28 NOTE — ED Notes (Signed)
Pt reports falling about a week ago and hitting left side on stairs.  Reporting increased pain since that time.

## 2012-04-28 NOTE — ED Provider Notes (Signed)
History     CSN: [id number]  Arrival date & time 04/28/12  2300   First MD Initiated Contact with Patient 04/28/12 2303      Chief Complaint  Patient presents with  . Chest Pain    (Consider location/radiation/quality/duration/timing/severity/associated sxs/prior treatment) HPI Comments: Patient is a 27 year old female who presents to the emergency department with left rib and side pain. The patient states that approximately one week ago she was going up steps when she fell and injured her left side. The patient states that she has been having pain in her side since that time, but was trying to see if the pain would go away on its own. She has been using Norco for the pain but states that it is gradually getting worse instead of better. The patient denies any recent operations or procedures involving the chest. She denies being on any platelet altering medications. She has not had hemoptysis or high fevers. His been no blood in the urine. Patient is concerned that it is taking so long for the pain 2 Colace and desires to know she has a broken rib.  The history is provided by the patient.    Past Medical History  Diagnosis Date  . Degenerative disc disease   . Thyroid disease   . Panic attacks     Past Surgical History  Procedure Date  . Tubal ligation     History reviewed. No pertinent family history.  History  Substance Use Topics  . Smoking status: Current Every Day Smoker -- 1.0 packs/day  . Smokeless tobacco: Not on file  . Alcohol Use: No    OB History    Grav Para Term Preterm Abortions TAB SAB Ect Mult Living                  Review of Systems  Constitutional: Negative for activity change.       All ROS Neg except as noted in HPI  HENT: Negative for nosebleeds and neck pain.   Eyes: Negative for photophobia and discharge.  Respiratory: Negative for cough, shortness of breath and wheezing.   Cardiovascular: Negative for chest pain and palpitations.    Gastrointestinal: Negative for abdominal pain and blood in stool.  Genitourinary: Negative for dysuria, frequency and hematuria.  Musculoskeletal: Positive for back pain. Negative for arthralgias.  Skin: Negative.   Neurological: Positive for headaches. Negative for dizziness, seizures and speech difficulty.  Psychiatric/Behavioral: Negative for hallucinations and confusion. The patient is nervous/anxious.        Panic attacks    Allergies  Amoxicillin and Penicillins  Home Medications   Current Outpatient Rx  Name  Route  Sig  Dispense  Refill  . HYDROCODONE-ACETAMINOPHEN 5-325 MG PO TABS   Oral   Take 1 tablet by mouth every 6 (six) hours as needed for pain.   42 tablet   4   . LEVOTHYROXINE SODIUM 175 MCG PO TABS   Oral   Take 175 mcg by mouth daily.           BP 121/81  Pulse 97  Temp 97.7 F (36.5 C) (Oral)  Ht 5\' 7"  (1.702 m)  Wt 180 lb (81.647 kg)  BMI 28.19 kg/m2  SpO2 100%  LMP 04/27/2012  Physical Exam  Nursing note and vitals reviewed. Constitutional: She is oriented to person, place, and time. She appears well-developed and well-nourished.  Non-toxic appearance.  HENT:  Head: Normocephalic.  Right Ear: Tympanic membrane and external ear normal.  Left Ear:  Tympanic membrane and external ear normal.  Eyes: EOM and lids are normal. Pupils are equal, round, and reactive to light.  Neck: Normal range of motion. Neck supple. Carotid bruit is not present.  Cardiovascular: Normal rate, regular rhythm, normal heart sounds, intact distal pulses and normal pulses.   Pulmonary/Chest: Breath sounds normal. No respiratory distress. She has no wheezes. She has no rales. She exhibits tenderness.       There is no bruise or visible deformity to the left rib and flank area. There is symmetrical rise and fall of the chest. The patient speaks in complete sentences. There is pain to the mid to lower lateral left flank. Breath sounds are symmetrical.  Abdominal: Soft.  Bowel sounds are normal. There is no tenderness. There is no guarding.  Musculoskeletal: Normal range of motion.  Lymphadenopathy:       Head (right side): No submandibular adenopathy present.       Head (left side): No submandibular adenopathy present.    She has no cervical adenopathy.  Neurological: She is alert and oriented to person, place, and time. She has normal strength. No cranial nerve deficit or sensory deficit.  Skin: Skin is warm and dry.  Psychiatric: She has a normal mood and affect. Her speech is normal.    ED Course  Procedures (including critical care time)  Labs Reviewed - No data to display No results found.  Pulse oximetry 100% on room air. Within normal limits by my interpretation. No diagnosis found.    MDM  I have reviewed nursing notes, vital signs, and all appropriate lab and imaging results for this patient.  Xray of the left rib and chest neg for fracture or  Acute problem. Rx for diclofenac, robaxin, and norco. Pt to follow up with her pcp if  Not improving.      Kathie Dike, [place] 04/29/12 [phone]

## 2012-04-29 MED ORDER — METHOCARBAMOL 500 MG PO TABS: ORAL_TABLET | ORAL | Status: DC

## 2012-04-29 MED ORDER — KETOROLAC TROMETHAMINE 10 MG PO TABS
10.0000 mg | ORAL_TABLET | Freq: Once | ORAL | Status: AC
  Administered 2012-04-29: 10 mg via ORAL
  Filled 2012-04-29: qty 1

## 2012-04-29 MED ORDER — ONDANSETRON HCL 4 MG PO TABS
4.0000 mg | ORAL_TABLET | Freq: Once | ORAL | Status: AC
  Administered 2012-04-29: 4 mg via ORAL
  Filled 2012-04-29: qty 1

## 2012-04-29 MED ORDER — HYDROCODONE-ACETAMINOPHEN 5-325 MG PO TABS: ORAL_TABLET | ORAL | Status: DC

## 2012-04-29 MED ORDER — HYDROCODONE-ACETAMINOPHEN 5-325 MG PO TABS
2.0000 | ORAL_TABLET | Freq: Once | ORAL | Status: AC
  Administered 2012-04-29: 2 via ORAL
  Filled 2012-04-29: qty 2

## 2012-04-29 MED ORDER — DICLOFENAC SODIUM 75 MG PO TBEC: 75.0000 mg | DELAYED_RELEASE_TABLET | Freq: Two times a day (BID) | ORAL | Status: DC

## 2012-04-29 MED ORDER — DIAZEPAM 5 MG PO TABS
5.0000 mg | ORAL_TABLET | Freq: Once | ORAL | Status: AC
  Administered 2012-04-29: 5 mg via ORAL
  Filled 2012-04-29: qty 1

## 2012-04-29 NOTE — ED Notes (Signed)
Pt discharged. Pt stable at time of discharge. Medications reviewed pt has no questions regarding discharge at this time. Pt voiced understanding of discharge instructions.  

## 2012-04-30 NOTE — ED Provider Notes (Signed)
Medical screening examination/treatment/procedure(s) were performed by non-physician practitioner and as supervising physician I was immediately available for consultation/collaboration.  Bellarae Lizer S. Jeshawn Melucci, MD 04/30/12 0744 

## 2012-05-20 ENCOUNTER — Emergency Department (HOSPITAL_COMMUNITY)
Admission: EM | Admit: 2012-05-20 | Discharge: 2012-05-20 | Disposition: A | Discharge status: Home / Self Care | Payer: Medicaid Other | Attending: Emergency Medicine | Admitting: Emergency Medicine

## 2012-05-20 ENCOUNTER — Emergency Department (HOSPITAL_COMMUNITY): Payer: Medicaid Other

## 2012-05-20 ENCOUNTER — Encounter (HOSPITAL_COMMUNITY): Payer: Self-pay | Admitting: *Deleted

## 2012-05-20 DIAGNOSIS — F410 Panic disorder [episodic paroxysmal anxiety] without agoraphobia: Secondary | ICD-10-CM | POA: Insufficient documentation

## 2012-05-20 DIAGNOSIS — E079 Disorder of thyroid, unspecified: Secondary | ICD-10-CM | POA: Insufficient documentation

## 2012-05-20 DIAGNOSIS — Y9301 Activity, walking, marching and hiking: Secondary | ICD-10-CM | POA: Insufficient documentation

## 2012-05-20 DIAGNOSIS — Z79899 Other long term (current) drug therapy: Secondary | ICD-10-CM | POA: Insufficient documentation

## 2012-05-20 DIAGNOSIS — Y929 Unspecified place or not applicable: Secondary | ICD-10-CM | POA: Insufficient documentation

## 2012-05-20 DIAGNOSIS — F17200 Nicotine dependence, unspecified, uncomplicated: Secondary | ICD-10-CM | POA: Insufficient documentation

## 2012-05-20 DIAGNOSIS — IMO0002 Reserved for concepts with insufficient information to code with codable children: Secondary | ICD-10-CM | POA: Insufficient documentation

## 2012-05-20 DIAGNOSIS — W010XXA Fall on same level from slipping, tripping and stumbling without subsequent striking against object, initial encounter: Secondary | ICD-10-CM | POA: Insufficient documentation

## 2012-05-20 DIAGNOSIS — G43909 Migraine, unspecified, not intractable, without status migrainosus: Secondary | ICD-10-CM | POA: Insufficient documentation

## 2012-05-20 DIAGNOSIS — S93409A Sprain of unspecified ligament of unspecified ankle, initial encounter: Secondary | ICD-10-CM

## 2012-05-20 MED ORDER — PROMETHAZINE HCL 25 MG/ML IJ SOLN
25.0000 mg | INTRAMUSCULAR | Status: DC | PRN
  Administered 2012-05-20: 25 mg via INTRAMUSCULAR
  Filled 2012-05-20: qty 1

## 2012-05-20 MED ORDER — KETOROLAC TROMETHAMINE 30 MG/ML IJ SOLN
30.0000 mg | Freq: Once | INTRAMUSCULAR | Status: AC
  Administered 2012-05-20: 30 mg via INTRAMUSCULAR
  Filled 2012-05-20: qty 1

## 2012-05-20 MED ORDER — HYDROMORPHONE HCL PF 1 MG/ML IJ SOLN
1.0000 mg | Freq: Once | INTRAMUSCULAR | Status: AC
  Administered 2012-05-20: 1 mg via INTRAMUSCULAR
  Filled 2012-05-20: qty 1

## 2012-05-20 NOTE — ED Provider Notes (Signed)
History     CSN: [id number]  Arrival date & time 05/20/12  2202   First MD Initiated Contact with Patient 05/20/12 2215      Chief Complaint  Patient presents with  . Migraine  . Ankle Pain     HPI Pt c/o "migraine" x 3 days also states she twisted her right ankle on Tuesday (which she broke in October.) No other symptoms reported.  Past Medical History  Diagnosis Date  . Degenerative disc disease   . Thyroid disease   . Panic attacks     Past Surgical History  Procedure Laterality Date  . Tubal ligation      History reviewed. No pertinent family history.  History  Substance Use Topics  . Smoking status: Current Every Day Smoker -- 1.00 packs/day  . Smokeless tobacco: Not on file  . Alcohol Use: No    OB History   Grav Para Term Preterm Abortions TAB SAB Ect Mult Living                  Review of Systems All other systems reviewed and are negative Allergies  Amoxicillin and Penicillins  Home Medications   Current Outpatient Rx  Name  Route  Sig  Dispense  Refill  . levothyroxine (SYNTHROID, LEVOTHROID) 175 MCG tablet   Oral   Take 175 mcg by mouth daily.         . methocarbamol (ROBAXIN) 500 MG tablet   Oral   Take 1,000 mg by mouth 3 (three) times daily. Spasm/pain           BP 106/75  Pulse 85  Resp 20  Ht 5\' 7"  (1.702 m)  Wt 155 lb (70.308 kg)  BMI 24.27 kg/m2  SpO2 100%  LMP 04/27/2012  Physical Exam  Nursing note and vitals reviewed. Constitutional: She is oriented to person, place, and time. She appears well-developed and well-nourished. No distress.  HENT:  Head: Normocephalic and atraumatic.  Eyes: Pupils are equal, round, and reactive to light.  Neck: Normal range of motion.  Cardiovascular: Normal rate and intact distal pulses.   Pulmonary/Chest: No respiratory distress.  Abdominal: Normal appearance. She exhibits no distension.  Musculoskeletal: She exhibits tenderness (Lateral malleolus of right ankle).  Neurological:  She is alert and oriented to person, place, and time. She displays normal reflexes. No cranial nerve deficit or sensory deficit. She exhibits normal muscle tone. Coordination normal. GCS eye subscore is 4. GCS verbal subscore is 5. GCS motor subscore is 6.  Skin: Skin is warm and dry. No rash noted.  Psychiatric: She has a normal mood and affect. Her behavior is normal.    ED Course  Procedures (including critical care time) Meds ordered this encounter  Medications  . methocarbamol (ROBAXIN) 500 MG tablet    Sig: Take 1,000 mg by mouth 3 (three) times daily. Spasm/pain  . HYDROmorphone (DILAUDID) injection 1 mg    Sig:   . ketorolac (TORADOL) 30 MG/ML injection 30 mg    Sig:   . promethazine (PHENERGAN) injection 25 mg    Sig:     Labs Reviewed - No data to display Dg Ankle Complete Right  05/20/2012  *RADIOLOGY REPORT*  Clinical Data: Right ankle pain and swelling, status post fall 4 days ago.  RIGHT ANKLE - COMPLETE 3+ VIEW  Comparison: Right ankle radiographs performed 01/28/2012  Findings: There is no evidence of fracture or dislocation.  The ankle mortise is intact; the interosseous space is within normal limits.  No talar tilt or subluxation is seen.  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  The joint spaces are preserved.  No significant soft tissue abnormalities are seen.  IMPRESSION:  1.  No evidence of fracture or dislocation. 2.  Os trigonum again noted.   Original Report Authenticated By: Tonia Ghent, M.D.      1. Migraine   2. Ankle sprain       MDM          Nelia Shi, MD 05/20/12 2259

## 2012-05-20 NOTE — ED Notes (Signed)
Pt c/o "migraine" x 3 days also states she twisted her right ankle on Tuesday (which she broke in October.) No other symptoms reported.

## 2012-05-26 ENCOUNTER — Emergency Department (HOSPITAL_COMMUNITY)
Admission: EM | Admit: 2012-05-26 | Discharge: 2012-05-27 | Disposition: A | Discharge status: Home / Self Care | Payer: Medicaid Other | Attending: Emergency Medicine | Admitting: Emergency Medicine

## 2012-05-26 DIAGNOSIS — E079 Disorder of thyroid, unspecified: Secondary | ICD-10-CM | POA: Insufficient documentation

## 2012-05-26 DIAGNOSIS — IMO0002 Reserved for concepts with insufficient information to code with codable children: Secondary | ICD-10-CM | POA: Insufficient documentation

## 2012-05-26 DIAGNOSIS — M25569 Pain in unspecified knee: Secondary | ICD-10-CM | POA: Insufficient documentation

## 2012-05-26 DIAGNOSIS — F410 Panic disorder [episodic paroxysmal anxiety] without agoraphobia: Secondary | ICD-10-CM | POA: Insufficient documentation

## 2012-05-26 DIAGNOSIS — S8990XA Unspecified injury of unspecified lower leg, initial encounter: Secondary | ICD-10-CM

## 2012-05-26 DIAGNOSIS — F17200 Nicotine dependence, unspecified, uncomplicated: Secondary | ICD-10-CM | POA: Insufficient documentation

## 2012-05-26 DIAGNOSIS — G43909 Migraine, unspecified, not intractable, without status migrainosus: Secondary | ICD-10-CM | POA: Insufficient documentation

## 2012-05-26 DIAGNOSIS — Z79899 Other long term (current) drug therapy: Secondary | ICD-10-CM | POA: Insufficient documentation

## 2012-05-27 ENCOUNTER — Encounter (HOSPITAL_COMMUNITY): Payer: Self-pay | Admitting: *Deleted

## 2012-05-27 MED ORDER — KETOROLAC TROMETHAMINE 60 MG/2ML IM SOLN
60.0000 mg | Freq: Once | INTRAMUSCULAR | Status: AC
  Administered 2012-05-27: 60 mg via INTRAMUSCULAR
  Filled 2012-05-27: qty 2

## 2012-05-27 MED ORDER — DEXAMETHASONE SODIUM PHOSPHATE 4 MG/ML IJ SOLN
8.0000 mg | Freq: Once | INTRAMUSCULAR | Status: AC
  Administered 2012-05-27: 8 mg via INTRAMUSCULAR
  Filled 2012-05-27: qty 1

## 2012-05-27 MED ORDER — METOCLOPRAMIDE HCL 5 MG/ML IJ SOLN
10.0000 mg | Freq: Once | INTRAMUSCULAR | Status: AC
  Administered 2012-05-27: 10 mg via INTRAMUSCULAR
  Filled 2012-05-27: qty 2

## 2012-05-27 MED ORDER — DIPHENHYDRAMINE HCL 25 MG PO CAPS
25.0000 mg | ORAL_CAPSULE | Freq: Once | ORAL | Status: AC
  Administered 2012-05-27: 25 mg via ORAL
  Filled 2012-05-27: qty 1

## 2012-05-27 NOTE — ED Notes (Signed)
Pt c/o severe headache. Pt states she was seen in ED this past week and tx for headache with minor relief now worse.

## 2012-05-27 NOTE — ED Provider Notes (Signed)
History     CSN: [id number]  Arrival date & time 05/26/12  2351   First MD Initiated Contact with Patient 05/27/12 [phone]      Chief Complaint  Patient presents with  . Migraine  . Knee Injury    (Consider location/radiation/quality/duration/timing/severity/associated sxs/prior treatment) HPI Kelly Richard is a 27 y.o. female who presents to the Emergency Department complaining of migraine  Headache and left knee pain. She was seen here on Tuesday for the headache which was resolved when she left. It returned the next day and she has had it daily. She has taken tylenol with no relief. Denies vision changes, hearing changes, stiff neck, nausea, vomiting. Past Medical History  Diagnosis Date  . Degenerative disc disease   . Thyroid disease   . Panic attacks     Past Surgical History  Procedure Laterality Date  . Tubal ligation      History reviewed. No pertinent family history.  History  Substance Use Topics  . Smoking status: Current Every Day Smoker -- 1.00 packs/day  . Smokeless tobacco: Not on file  . Alcohol Use: No    OB History   Grav Para Term Preterm Abortions TAB SAB Ect Mult Living                  Review of Systems  Constitutional: Negative for fever.       10 Systems reviewed and are negative for acute change except as noted in the HPI.  HENT: Negative for congestion.   Eyes: Negative for discharge and redness.  Respiratory: Negative for cough and shortness of breath.   Cardiovascular: Negative for chest pain.  Gastrointestinal: Negative for vomiting and abdominal pain.  Musculoskeletal: Negative for back pain.  Skin: Negative for rash.  Neurological: Positive for headaches. Negative for syncope and numbness.  Psychiatric/Behavioral:       No behavior change.    Allergies  Amoxicillin and Penicillins  Home Medications   Current Outpatient Rx  Name  Route  Sig  Dispense  Refill  . levothyroxine (SYNTHROID, LEVOTHROID) 175 MCG tablet    Oral   Take 175 mcg by mouth daily.         . methocarbamol (ROBAXIN) 500 MG tablet   Oral   Take 1,000 mg by mouth 3 (three) times daily. Spasm/pain           BP 114/64  Pulse 96  Temp(Src) 97.5 F (36.4 C) (Oral)  Resp 18  Ht 5' 7.75" (1.721 m)  Wt 155 lb (70.308 kg)  BMI 23.74 kg/m2  SpO2 92%  LMP 04/27/2012  Physical Exam  Nursing note and vitals reviewed. Constitutional: She is oriented to person, place, and time.  Awake, alert, nontoxic appearance.  HENT:  Head: Atraumatic.  Eyes: Right eye exhibits no discharge. Left eye exhibits no discharge.  Neck: Neck supple.  Pulmonary/Chest: Effort normal. She exhibits no tenderness.  Abdominal: Soft. There is no tenderness. There is no rebound.  Musculoskeletal: She exhibits no tenderness.  Baseline ROM, no obvious new focal weakness.  Neurological: She is alert and oriented to person, place, and time. She has normal reflexes. No cranial nerve deficit.  Mental status and motor strength appears baseline for patient and situation.  Skin: No rash noted.  Bruise to left knee   Psychiatric: She has a normal mood and affect.    ED Course  Procedures (including critical care time)    MDM  Patient with migraine headache present since Tuesday. Given a  cocktail for migraine with some relief. Pt stable in ED with no significant deterioration in condition.The patient appears reasonably screened and/or stabilized for discharge and I doubt any other medical condition or other [facility] requiring further screening, evaluation, or treatment in the ED at this time prior to discharge.  MDM Reviewed: nursing note and vitals          Nicoletta Dress. Colon Branch, MD 05/27/12 1610

## 2012-05-27 NOTE — ED Notes (Signed)
Pt discharged. Pt stable at time of discharge. pt has no questions regarding discharge at this time. Pt voiced understanding of discharge instructions.  

## 2012-10-09 ENCOUNTER — Encounter (HOSPITAL_COMMUNITY): Payer: Self-pay | Admitting: *Deleted

## 2012-10-09 ENCOUNTER — Emergency Department (HOSPITAL_COMMUNITY)
Admission: EM | Admit: 2012-10-09 | Discharge: 2012-10-09 | Disposition: A | Discharge status: Home / Self Care | Payer: Self-pay | Attending: Emergency Medicine | Admitting: Emergency Medicine

## 2012-10-09 DIAGNOSIS — E079 Disorder of thyroid, unspecified: Secondary | ICD-10-CM | POA: Insufficient documentation

## 2012-10-09 DIAGNOSIS — F17200 Nicotine dependence, unspecified, uncomplicated: Secondary | ICD-10-CM | POA: Insufficient documentation

## 2012-10-09 DIAGNOSIS — Z8659 Personal history of other mental and behavioral disorders: Secondary | ICD-10-CM | POA: Insufficient documentation

## 2012-10-09 DIAGNOSIS — Z8739 Personal history of other diseases of the musculoskeletal system and connective tissue: Secondary | ICD-10-CM | POA: Insufficient documentation

## 2012-10-09 DIAGNOSIS — K0889 Other specified disorders of teeth and supporting structures: Secondary | ICD-10-CM

## 2012-10-09 DIAGNOSIS — Z880 Allergy status to penicillin: Secondary | ICD-10-CM | POA: Insufficient documentation

## 2012-10-09 DIAGNOSIS — Z79899 Other long term (current) drug therapy: Secondary | ICD-10-CM | POA: Insufficient documentation

## 2012-10-09 DIAGNOSIS — K089 Disorder of teeth and supporting structures, unspecified: Secondary | ICD-10-CM | POA: Insufficient documentation

## 2012-10-09 MED ORDER — TRAMADOL HCL 50 MG PO TABS: 50.0000 mg | ORAL_TABLET | Freq: Four times a day (QID) | ORAL | Status: DC | PRN

## 2012-10-09 MED ORDER — NAPROXEN 500 MG PO TABS: 500.0000 mg | ORAL_TABLET | Freq: Two times a day (BID) | ORAL | Status: DC

## 2012-10-09 MED ORDER — CLINDAMYCIN HCL 150 MG PO CAPS: 150.0000 mg | ORAL_CAPSULE | Freq: Four times a day (QID) | ORAL | Status: DC

## 2012-10-09 NOTE — ED Provider Notes (Signed)
History    CSN: [id number] Arrival date & time 10/09/12  1029  First MD Initiated Contact with Patient 10/09/12 1050     Chief Complaint  Patient presents with  . Dental Pain   (Consider location/radiation/quality/duration/timing/severity/associated sxs/prior Treatment) Patient is a 27 y.o. female presenting with tooth pain. The history is provided by the patient.  Dental Pain Location:  Lower Quality:  Throbbing Severity:  Moderate Duration:  1 week Timing:  Constant Context: abscess   Relieved by:  Nothing Worsened by:  Nothing tried Ineffective treatments:  Topical anesthetic gel and NSAIDs Associated symptoms: no facial pain, no facial swelling, no fever, no headaches, no neck pain, no neck swelling, no oral bleeding and no trismus    Kelly Richard is a 27 y.o. female who presents to the ED with dental pain that started about a week ago. Patient states she has not had any dental problems in years.   Past Medical History  Diagnosis Date  . Degenerative disc disease   . Thyroid disease   . Panic attacks    Past Surgical History  Procedure Laterality Date  . Tubal ligation     No family history on file. History  Substance Use Topics  . Smoking status: Current Every Day Smoker -- 1.00 packs/day  . Smokeless tobacco: Not on file  . Alcohol Use: No   OB History   Grav Para Term Preterm Abortions TAB SAB Ect Mult Living                 Review of Systems  Constitutional: Negative for fever and chills.  HENT: Positive for dental problem. Negative for facial swelling and neck pain.   Respiratory: Negative for chest tightness.   Gastrointestinal: Negative for nausea, vomiting and abdominal pain.  Skin: Negative for rash.  Neurological: Negative for headaches.  Psychiatric/Behavioral: The patient is not nervous/anxious.     Allergies  Amoxicillin and Penicillins  Home Medications   Current Outpatient Rx  Name  Route  Sig  Dispense  Refill  . acetaminophen  (TYLENOL) 500 MG tablet   Oral   Take 1,000 mg by mouth every 6 (six) hours as needed for pain (headache).         Marland Kitchen levothyroxine (SYNTHROID, LEVOTHROID) 175 MCG tablet   Oral   Take 175 mcg by mouth daily.          BP 124/84  Pulse 82  Temp(Src) 98.3 F (36.8 C) (Oral)  Resp 16  SpO2 100% Physical Exam  Nursing note and vitals reviewed. Constitutional: She is oriented to person, place, and time. She appears well-developed and well-nourished.  HENT:  Head: Normocephalic.  Mouth/Throat:    Tenderness lower gum with swelling and abscess.  Eyes: EOM are normal.  Neck: Neck supple.  Cardiovascular: Normal rate.   Pulmonary/Chest: Effort normal.  Musculoskeletal: Normal range of motion.  Neurological: She is alert and oriented to person, place, and time. No cranial nerve deficit.  Skin: Skin is warm and dry.  Psychiatric: She has a normal mood and affect. Her behavior is normal.    ED Course  Procedures  MDM  27 y.o. female with dental abscess will treat with antibiotics and pain medication and she will follow up with a dentist as soon as possible.   Medication List    TAKE these medications       clindamycin 150 MG capsule  Commonly known as:  CLEOCIN  Take 1 capsule (150 mg total) by mouth every  6 (six) hours.     naproxen 500 MG tablet  Commonly known as:  NAPROSYN  Take 1 tablet (500 mg total) by mouth 2 (two) times daily.     traMADol 50 MG tablet  Commonly known as:  ULTRAM  Take 1 tablet (50 mg total) by mouth every 6 (six) hours as needed for pain.      ASK your doctor about these medications       acetaminophen 500 MG tablet  Commonly known as:  TYLENOL  Take 1,000 mg by mouth every 6 (six) hours as needed for pain (headache).     levothyroxine 175 MCG tablet  Commonly known as:  SYNTHROID, LEVOTHROID  Take 175 mcg by mouth daily.         Janne Napoleon, [place] 10/09/12 1651

## 2012-10-09 NOTE — ED Notes (Signed)
Dental pain to mid lower jaw that radiates to left, denies seeing a dentist due to no insurance

## 2012-10-09 NOTE — ED Notes (Signed)
Pt c/o left lower toothache that started a week ago,

## 2012-10-10 NOTE — ED Provider Notes (Signed)
Medical screening examination/treatment/procedure(s) were performed by non-physician practitioner and as supervising physician I was immediately available for consultation/collaboration. Devoria Albe, MD, Armando Gang   Ward Givens, MD 10/10/12 [phone]

## 2012-12-27 ENCOUNTER — Emergency Department (HOSPITAL_COMMUNITY): Payer: Self-pay

## 2012-12-27 ENCOUNTER — Encounter (HOSPITAL_COMMUNITY): Payer: Self-pay | Admitting: Emergency Medicine

## 2012-12-27 ENCOUNTER — Emergency Department (HOSPITAL_COMMUNITY)
Admission: EM | Admit: 2012-12-27 | Discharge: 2012-12-27 | Disposition: A | Discharge status: Home / Self Care | Payer: Self-pay | Attending: Emergency Medicine | Admitting: Emergency Medicine

## 2012-12-27 DIAGNOSIS — Z880 Allergy status to penicillin: Secondary | ICD-10-CM | POA: Insufficient documentation

## 2012-12-27 DIAGNOSIS — Z8659 Personal history of other mental and behavioral disorders: Secondary | ICD-10-CM | POA: Insufficient documentation

## 2012-12-27 DIAGNOSIS — Z8739 Personal history of other diseases of the musculoskeletal system and connective tissue: Secondary | ICD-10-CM | POA: Insufficient documentation

## 2012-12-27 DIAGNOSIS — Z79899 Other long term (current) drug therapy: Secondary | ICD-10-CM | POA: Insufficient documentation

## 2012-12-27 DIAGNOSIS — R630 Anorexia: Secondary | ICD-10-CM | POA: Insufficient documentation

## 2012-12-27 DIAGNOSIS — R0789 Other chest pain: Secondary | ICD-10-CM | POA: Insufficient documentation

## 2012-12-27 DIAGNOSIS — F17200 Nicotine dependence, unspecified, uncomplicated: Secondary | ICD-10-CM | POA: Insufficient documentation

## 2012-12-27 DIAGNOSIS — R091 Pleurisy: Secondary | ICD-10-CM | POA: Insufficient documentation

## 2012-12-27 DIAGNOSIS — Z3202 Encounter for pregnancy test, result negative: Secondary | ICD-10-CM | POA: Insufficient documentation

## 2012-12-27 DIAGNOSIS — E079 Disorder of thyroid, unspecified: Secondary | ICD-10-CM | POA: Insufficient documentation

## 2012-12-27 LAB — CBC WITH DIFFERENTIAL/PLATELET
Eosinophils Absolute: 0.1 10*3/uL (ref 0.0–0.7)
Hemoglobin: 14.5 g/dL (ref 12.0–15.0)
Lymphocytes Relative: 19 % (ref 12–46)
Lymphs Abs: 2 10*3/uL (ref 0.7–4.0)
MCH: 32 pg (ref 26.0–34.0)
Monocytes Relative: 8 % (ref 3–12)
Neutro Abs: 7.4 10*3/uL (ref 1.7–7.7)
Neutrophils Relative %: 71 % (ref 43–77)
Platelets: 232 10*3/uL (ref 150–400)
RBC: 4.53 MIL/uL (ref 3.87–5.11)
WBC: 10.3 10*3/uL (ref 4.0–10.5)

## 2012-12-27 LAB — COMPREHENSIVE METABOLIC PANEL
ALT: 11 U/L (ref 0–35)
Alkaline Phosphatase: 56 U/L (ref 39–117)
BUN: 8 mg/dL (ref 6–23)
Chloride: 103 mEq/L (ref 96–112)
GFR calc Af Amer: >90 mL/min (ref >90)
Glucose, Bld: 100 mg/dL — ABNORMAL HIGH (ref 70–99)
Potassium: 3.3 mEq/L — ABNORMAL LOW (ref 3.5–5.1)
Sodium: 139 mEq/L (ref 135–145)
Total Bilirubin: 0.6 mg/dL (ref 0.3–1.2)
Total Protein: 7.4 g/dL (ref 6.0–8.3)

## 2012-12-27 LAB — URINALYSIS, ROUTINE W REFLEX MICROSCOPIC
Glucose, UA: NEGATIVE mg/dL
Hgb urine dipstick: NEGATIVE
Leukocytes, UA: NEGATIVE
pH: 6.5 (ref 5.0–8.0)

## 2012-12-27 MED ORDER — HYDROCODONE-ACETAMINOPHEN 5-325 MG PO TABS
2.0000 | ORAL_TABLET | Freq: Once | ORAL | Status: AC
  Administered 2012-12-27: 2 via ORAL
  Filled 2012-12-27: qty 2

## 2012-12-27 MED ORDER — IBUPROFEN 800 MG PO TABS: 800.0000 mg | ORAL_TABLET | Freq: Three times a day (TID) | ORAL | Status: DC

## 2012-12-27 MED ORDER — POTASSIUM CHLORIDE CRYS ER 20 MEQ PO TBCR
40.0000 meq | EXTENDED_RELEASE_TABLET | Freq: Once | ORAL | Status: AC
  Administered 2012-12-27: 40 meq via ORAL
  Filled 2012-12-27: qty 2

## 2012-12-27 MED ORDER — HYDROCODONE-ACETAMINOPHEN 5-325 MG PO TABS: 2.0000 | ORAL_TABLET | ORAL | Status: DC | PRN

## 2012-12-27 NOTE — ED Notes (Signed)
Pt c/o increasing SOB accompanied by stabbing chest pain which radiates into her back. Onset of symptoms Sat with increasing frequency and strength.

## 2012-12-27 NOTE — ED Provider Notes (Signed)
CSN: [id number]     Arrival date & time 12/27/12  1804 History  This chart was scribed for Glynn Octave, MD by Bennett Scrape, ED Scribe. This patient was seen in room APA12/APA12 and the patient's care was started at 6:55 PM.   Chief Complaint  Patient presents with  . Shortness of Breath  . Chest Pain    The history is provided by the patient. No language interpreter was used.   HPI Comments: Kelly Richard is a 27 y.o. female who presents to the Emergency Department complaining of 4 to 5 days of constant left lateral CP that started under her left breast and radiates to the posterior left upper ribs. She reports that she developed palpitations while running and playing in a yard and has since developed the left lateral CP and SOB. She describes the CP as waxing and waning with sharp. Shooting pains felt at the worst peaks. She also reports a decreased appetite since the onset. She denies any recent, falls or traumas and reports one prior episode of the same diagnosed as pleurisy. She denies abdominal pain and back pain as associated symptoms. She has a h/o cervical cancer with a h/o freezing and BTL. She denies being on any birth control contraceptives.  Pt is a current everyday smoker and occasional alcohol user. She denies that she was smoking with the original onset.  Past Medical History  Diagnosis Date  . Degenerative disc disease   . Thyroid disease   . Panic attacks    Past Surgical History  Procedure Laterality Date  . Tubal ligation     Family History  Problem Relation Age of Onset  . Cancer Mother   . Cancer Father   . Heart failure Other   . COPD Other   . Stroke Other    History  Substance Use Topics  . Smoking status: Current Every Day Smoker -- 1.00 packs/day for 14 years    Types: Cigarettes  . Smokeless tobacco: Never Used  . Alcohol Use: Yes     Comment: occas   OB History   Grav Para Term Preterm Abortions TAB SAB Ect Mult Living   8 2 2  6  6   2       Review of Systems  A complete 10 system review of systems was obtained and all systems are negative except as noted in the HPI and PMH.   Allergies  Amoxicillin and Penicillins  Home Medications   Current Outpatient Rx  Name  Route  Sig  Dispense  Refill  . acetaminophen (TYLENOL) 500 MG tablet   Oral   Take 1,000 mg by mouth every 6 (six) hours as needed for pain.          Marland Kitchen ibuprofen (ADVIL,MOTRIN) 200 MG tablet   Oral   Take 800 mg by mouth every 6 (six) hours as needed for pain.         Marland Kitchen levothyroxine (SYNTHROID, LEVOTHROID) 175 MCG tablet   Oral   Take 175 mcg by mouth daily.         Marland Kitchen HYDROcodone-acetaminophen (NORCO/VICODIN) 5-325 MG per tablet   Oral   Take 2 tablets by mouth every 4 (four) hours as needed for pain.   10 tablet   0   . ibuprofen (ADVIL,MOTRIN) 800 MG tablet   Oral   Take 1 tablet (800 mg total) by mouth 3 (three) times daily.   21 tablet   0    Triage Vitals: BP  123/80  Pulse 82  Temp(Src) 97.9 F (36.6 C) (Oral)  Resp 20  Ht 5\' 7"  (1.702 m)  Wt 170 lb (77.111 kg)  BMI 26.62 kg/m2  SpO2 100%  LMP 12/21/2012  Physical Exam  Nursing note and vitals reviewed. Constitutional: She is oriented to person, place, and time. She appears well-developed and well-nourished. No distress.  HENT:  Head: Normocephalic and atraumatic.  Mouth/Throat: Oropharynx is clear and moist.  Eyes: Conjunctivae and EOM are normal. Pupils are equal, round, and reactive to light.  Neck: Normal range of motion. Neck supple. No tracheal deviation present.  Cardiovascular: Normal rate, regular rhythm and normal heart sounds.   No murmur heard. Pulmonary/Chest: Effort normal and breath sounds normal. No respiratory distress. She has no wheezes. She has no rales. She exhibits tenderness.  Speaking in full sentences. Lateral left rib tenderness, no rash  Abdominal: Soft. Bowel sounds are normal. There is no tenderness.  Musculoskeletal: Normal range of  motion. She exhibits no edema.  Intact peripheral pulses, no peripheral edema  Neurological: She is alert and oriented to person, place, and time. No cranial nerve deficit.   5/5 strength throughout   Skin: Skin is warm and dry.  Psychiatric: She has a normal mood and affect. Her behavior is normal.    ED Course  Procedures (including critical care time)  Medications  potassium chloride SA (K-DUR,KLOR-CON) CR tablet 40 mEq (not administered)  HYDROcodone-acetaminophen (NORCO/VICODIN) 5-325 MG per tablet 2 tablet (2 tablets Oral Given 12/27/12 1914)    DIAGNOSTIC STUDIES: Oxygen Saturation is 100% on room air, normal by my interpretation.    COORDINATION OF CARE: 7:02 PM-Discussed treatment plan which includes pain medications, CXR, CBC panel, CMP and d-dimer with pt at bedside and pt agreed to plan.   8:13 PM-Pt rechecked and feels improved with medications listed above. She states that her migraine is now a mild HA and the CP is now felt only with deep inspiration. Informed pt of negative radiology and lab work results. Discussed discharge plan which includes antiinflammatories for pleurisy with pt and pt agreed to plan. Also advised pt to follow up as needed and pt agreed. Addressed symptoms to return for with pt such as a rash.   Labs Review Labs Reviewed  COMPREHENSIVE METABOLIC PANEL - Abnormal; Notable for the following:    Potassium 3.3 (*)    Glucose, Bld 100 (*)    All other components within normal limits  URINALYSIS, ROUTINE W REFLEX MICROSCOPIC - Abnormal; Notable for the following:    Specific Gravity, Urine <1.005 (*)    All other components within normal limits  CBC WITH DIFFERENTIAL  TROPONIN I  D-DIMER, QUANTITATIVE  PREGNANCY, URINE   Imaging Review Dg Chest 2 View  12/27/2012   *RADIOLOGY REPORT*  Clinical Data: Shortness of breath, chest pain  CHEST - 2 VIEW  Comparison: April 28, 2012.  Findings: Cardiomediastinal silhouette appears normal.  No acute  pulmonary disease is noted.  Bony thorax is intact.  No pleural effusion or pneumothorax is noted.  IMPRESSION: No acute cardiopulmonary abnormality seen.   Original Report Authenticated By: Lupita Raider.,  M.D.    MDM   1. Pleurisy    4 day history of shortness of breath, left lateral chest pain that is worse with inspiration. No trauma. No cough or fever.  EKG normal sinus rhythm with PVCs. Chest x-ray is negative for rib fracture pneumothorax. Troponin negative, d-dimer negative. Will replete potassium.  Patient's pain seems to be  consistent with pleurisy. She's had this before. We'll treat with anti-inflammatories. Advised to monitor her skin to see if she develops a rash as this could represent early shingles as well. Followup with her doctor. Smoking cessation encouraged.    Date: 12/27/2012  Rate: 60  Rhythm: normal sinus rhythm  QRS Axis: normal  Intervals: normal  ST/T Wave abnormalities: normal  Conduction Disutrbances:none  Narrative Interpretation:   Old EKG Reviewed: none available     I personally performed the services described in this documentation, which was scribed in my presence. The recorded information has been reviewed and is accurate.       Glynn Octave, MD 12/27/12 2026

## 2013-01-07 ENCOUNTER — Other Ambulatory Visit: Payer: Self-pay | Admitting: *Deleted

## 2013-01-07 DIAGNOSIS — N63 Unspecified lump in unspecified breast: Secondary | ICD-10-CM

## 2013-01-08 ENCOUNTER — Inpatient Hospital Stay (HOSPITAL_COMMUNITY): Admission: RE | Admit: 2013-01-08 | Payer: Self-pay | Source: Ambulatory Visit

## 2013-01-14 ENCOUNTER — Other Ambulatory Visit: Payer: Self-pay

## 2013-02-13 ENCOUNTER — Encounter (HOSPITAL_COMMUNITY): Payer: Self-pay | Admitting: Emergency Medicine

## 2013-02-13 ENCOUNTER — Emergency Department (HOSPITAL_COMMUNITY)
Admission: EM | Admit: 2013-02-13 | Discharge: 2013-02-13 | Disposition: A | Discharge status: Home / Self Care | Payer: Self-pay | Attending: Emergency Medicine | Admitting: Emergency Medicine

## 2013-02-13 DIAGNOSIS — Z8659 Personal history of other mental and behavioral disorders: Secondary | ICD-10-CM | POA: Insufficient documentation

## 2013-02-13 DIAGNOSIS — K5289 Other specified noninfective gastroenteritis and colitis: Secondary | ICD-10-CM | POA: Insufficient documentation

## 2013-02-13 DIAGNOSIS — Z79899 Other long term (current) drug therapy: Secondary | ICD-10-CM | POA: Insufficient documentation

## 2013-02-13 DIAGNOSIS — Z880 Allergy status to penicillin: Secondary | ICD-10-CM | POA: Insufficient documentation

## 2013-02-13 DIAGNOSIS — F17200 Nicotine dependence, unspecified, uncomplicated: Secondary | ICD-10-CM | POA: Insufficient documentation

## 2013-02-13 DIAGNOSIS — K529 Noninfective gastroenteritis and colitis, unspecified: Secondary | ICD-10-CM

## 2013-02-13 DIAGNOSIS — Z8739 Personal history of other diseases of the musculoskeletal system and connective tissue: Secondary | ICD-10-CM | POA: Insufficient documentation

## 2013-02-13 DIAGNOSIS — E039 Hypothyroidism, unspecified: Secondary | ICD-10-CM | POA: Insufficient documentation

## 2013-02-13 DIAGNOSIS — Z3202 Encounter for pregnancy test, result negative: Secondary | ICD-10-CM | POA: Insufficient documentation

## 2013-02-13 DIAGNOSIS — G43909 Migraine, unspecified, not intractable, without status migrainosus: Secondary | ICD-10-CM | POA: Insufficient documentation

## 2013-02-13 LAB — CBC WITH DIFFERENTIAL/PLATELET
Basophils Absolute: 0 10*3/uL (ref 0.0–0.1)
Basophils Relative: 0 % (ref 0–1)
Eosinophils Absolute: 0 10*3/uL (ref 0.0–0.7)
HCT: 43.6 % (ref 36.0–46.0)
Hemoglobin: 14.8 g/dL (ref 12.0–15.0)
Lymphs Abs: 1.3 10*3/uL (ref 0.7–4.0)
MCH: 32 pg (ref 26.0–34.0)
MCHC: 33.9 g/dL (ref 30.0–36.0)
MCV: 94.2 fL (ref 78.0–100.0)
Monocytes Absolute: 0.4 10*3/uL (ref 0.1–1.0)
Monocytes Relative: 7 % (ref 3–12)
Neutrophils Relative %: 67 % (ref 43–77)
RBC: 4.63 MIL/uL (ref 3.87–5.11)
RDW: 12.9 % (ref 11.5–15.5)

## 2013-02-13 LAB — URINALYSIS, ROUTINE W REFLEX MICROSCOPIC
Bilirubin Urine: NEGATIVE
Glucose, UA: NEGATIVE mg/dL
Hgb urine dipstick: NEGATIVE
Protein, ur: NEGATIVE mg/dL
Urobilinogen, UA: 4 mg/dL — ABNORMAL HIGH (ref 0.0–1.0)

## 2013-02-13 LAB — BASIC METABOLIC PANEL
BUN: 8 mg/dL (ref 6–23)
CO2: 27 mEq/L (ref 19–32)
Calcium: 9 mg/dL (ref 8.4–10.5)
Chloride: 103 mEq/L (ref 96–112)
Creatinine, Ser: 0.56 mg/dL (ref 0.50–1.10)
GFR calc Af Amer: >90 mL/min (ref >90)
GFR calc non Af Amer: >90 mL/min (ref >90)
Glucose, Bld: 117 mg/dL — ABNORMAL HIGH (ref 70–99)
Potassium: 3.3 mEq/L — ABNORMAL LOW (ref 3.5–5.1)
Sodium: 139 mEq/L (ref 135–145)

## 2013-02-13 LAB — PREGNANCY, URINE: Preg Test, Ur: NEGATIVE

## 2013-02-13 LAB — URINE MICROSCOPIC-ADD ON

## 2013-02-13 MED ORDER — DIPHENHYDRAMINE HCL 50 MG/ML IJ SOLN
25.0000 mg | Freq: Once | INTRAMUSCULAR | Status: AC
  Administered 2013-02-13: 25 mg via INTRAVENOUS
  Filled 2013-02-13: qty 1

## 2013-02-13 MED ORDER — KETOROLAC TROMETHAMINE 30 MG/ML IJ SOLN
30.0000 mg | Freq: Once | INTRAMUSCULAR | Status: AC
  Administered 2013-02-13: 30 mg via INTRAVENOUS
  Filled 2013-02-13: qty 1

## 2013-02-13 MED ORDER — SODIUM CHLORIDE 0.9 % IV BOLUS (SEPSIS)
1000.0000 mL | Freq: Once | INTRAVENOUS | Status: AC
  Administered 2013-02-13: 1000 mL via INTRAVENOUS

## 2013-02-13 MED ORDER — PROMETHAZINE HCL 25 MG PO TABS: 25.0000 mg | ORAL_TABLET | Freq: Four times a day (QID) | ORAL | Status: DC | PRN

## 2013-02-13 MED ORDER — METOCLOPRAMIDE HCL 5 MG/ML IJ SOLN
10.0000 mg | Freq: Once | INTRAMUSCULAR | Status: AC
  Administered 2013-02-13: 10 mg via INTRAVENOUS
  Filled 2013-02-13: qty 2

## 2013-02-13 NOTE — ED Notes (Signed)
Pt reports body aches, migraine and nausea since yesterday.  No fever or cough.

## 2013-02-13 NOTE — ED Notes (Signed)
Pt asleep when entered the room. Pt states her headache is better. Pt states still unable to give urine sample.

## 2013-02-13 NOTE — ED Provider Notes (Signed)
CSN: [id number]     Arrival date & time 02/13/13  1449 History   First MD Initiated Contact with Patient 02/13/13 1703     Chief Complaint  Patient presents with  . Nausea  . Migraine  . Generalized Body Aches   (Consider location/radiation/quality/duration/timing/severity/associated sxs/prior Treatment) HPI .... nausea, vomiting, achiness, headache for 24 hours.   Severity is mild to moderate. Nothing makes symptoms better or worse. No chest pain, dyspnea, syncope, neuro deficits.  She feels dehydrated.   Past Medical History  Diagnosis Date  . Degenerative disc disease   . Thyroid disease   . Panic attacks    Past Surgical History  Procedure Laterality Date  . Tubal ligation     Family History  Problem Relation Age of Onset  . Cancer Mother   . Cancer Father   . Heart failure Other   . COPD Other   . Stroke Other    History  Substance Use Topics  . Smoking status: Current Every Day Smoker -- 1.00 packs/day for 14 years    Types: Cigarettes  . Smokeless tobacco: Never Used  . Alcohol Use: Yes     Comment: occas   OB History   Grav Para Term Preterm Abortions TAB SAB Ect Mult Living   8 2 2  6  6   2      Review of Systems  All other systems reviewed and are negative.    Allergies  Amoxicillin and Penicillins  Home Medications   Current Outpatient Rx  Name  Route  Sig  Dispense  Refill  . acetaminophen (TYLENOL) 500 MG tablet   Oral   Take 1,000 mg by mouth every 6 (six) hours as needed for pain.          Marland Kitchen ibuprofen (ADVIL,MOTRIN) 200 MG tablet   Oral   Take 800 mg by mouth every 6 (six) hours as needed for pain.         Marland Kitchen levothyroxine (SYNTHROID, LEVOTHROID) 175 MCG tablet   Oral   Take 175 mcg by mouth daily.          BP 107/61  Pulse 66  Temp(Src) 98.1 F (36.7 C) (Oral)  Resp 20  Ht 5' 7.75" (1.721 m)  Wt 170 lb (77.111 kg)  BMI 26.03 kg/m2  SpO2 99%  LMP 02/07/2013 Physical Exam  Nursing note and vitals  reviewed. Constitutional: She is oriented to person, place, and time. She appears well-developed and well-nourished.  HENT:  Head: Normocephalic and atraumatic.  Eyes: Conjunctivae and EOM are normal. Pupils are equal, round, and reactive to light.  Neck: Normal range of motion. Neck supple.  Cardiovascular: Normal rate, regular rhythm and normal heart sounds.   Pulmonary/Chest: Effort normal and breath sounds normal.  Abdominal: Soft. Bowel sounds are normal.  Musculoskeletal: Normal range of motion.  Neurological: She is alert and oriented to person, place, and time.  Skin: Skin is warm and dry.  Psychiatric: She has a normal mood and affect.    ED Course  Procedures (including critical care time) Labs Review Labs Reviewed  BASIC METABOLIC PANEL - Abnormal; Notable for the following:    Potassium 3.3 (*)    Glucose, Bld 117 (*)    All other components within normal limits  URINALYSIS, ROUTINE W REFLEX MICROSCOPIC - Abnormal; Notable for the following:    Ketones, ur TRACE (*)    Urobilinogen, UA 4.0 (*)    Nitrite POSITIVE (*)    All other components within  normal limits  URINE MICROSCOPIC-ADD ON - Abnormal; Notable for the following:    Squamous Epithelial / LPF FEW (*)    Bacteria, UA MANY (*)    All other components within normal limits  URINE CULTURE  CBC WITH DIFFERENTIAL  PREGNANCY, URINE   Imaging Review No results found.  EKG Interpretation   None       MDM   1. Pre-syncope    Presyncope is incorrect diagnosis.     Correct dx is gastroenteritis. Patient feels much better after IV fluids. Discharge medications Phenergan 25 mg.  Screening labs normal    Donnetta Hutching, MD 02/13/13 2140

## 2013-02-13 NOTE — ED Notes (Signed)
Pt alert & oriented x4, stable gait. Patient given discharge instructions, paperwork & prescription(s). Patient  instructed to stop at the registration desk to finish any additional paperwork. Patient verbalized understanding. Pt left department w/ no further questions. 

## 2013-02-15 LAB — URINE CULTURE: Colony Count: >=100000

## 2013-02-16 NOTE — Progress Notes (Addendum)
ED Antimicrobial Stewardship Positive Culture Follow Up   Kelly Richard is an 28 y.o. female who presented to [facility] on 02/13/2013 with a chief complaint of  Chief Complaint  Patient presents with  . Nausea  . Migraine  . Generalized Body Aches    Recent Results (from the past 720 hour(s))  URINE CULTURE     Status: None   Collection Time    02/13/13  7:16 PM      Result Value Range Status   Specimen Description URINE, CLEAN CATCH   Final   Special Requests NONE   Final   Culture  Setup Time     Final   Value: 02/13/2013 21:30     Performed at [facility] Count     Final   Value: >=100,000 COLONIES/ML     Performed at [facility]   Culture     Final   Value: ESCHERICHIA COLI     Performed at [facility]   Report Status 02/15/2013 FINAL   Final   Organism ID, Bacteria ESCHERICHIA COLI   Final     [x]  Patient discharged originally without antimicrobial agent and treatment is now indicated  New antibiotic prescription: Ciprofloxacin 250mg  po BID for 3 days.  ED Provider: Irish Elders, FNP-C   Christoper Fabian, PharmD, BCPS Clinical pharmacist, pager [phone] 02/16/2013, 11:30 AM

## 2013-02-16 NOTE — ED Notes (Signed)
Post ED Visit - Positive Culture Follow-up: Successful Patient Follow-Up  Culture assessed and recommendations reviewed by: [x]  Positive urine culture  X] Patient discharged originally without antimicrobial agent and treatment is now indicated  New antibiotic prescription: Ciprofloxacin 250mg  po BID for 3 days.  ED Provider: Irish Elders, FNP-C  Christoper Fabian, PharmD, BCPS   Larena Sox 02/16/2013, 7:20 PM

## 2013-02-17 ENCOUNTER — Telehealth (HOSPITAL_COMMUNITY): Payer: Self-pay | Admitting: *Deleted

## 2013-02-17 NOTE — ED Notes (Signed)
Patient informed of results and rx called to [facility] pharmacy by [facility]

## 2013-04-13 ENCOUNTER — Emergency Department (HOSPITAL_COMMUNITY): Payer: Self-pay

## 2013-04-13 ENCOUNTER — Emergency Department (HOSPITAL_COMMUNITY)
Admission: EM | Admit: 2013-04-13 | Discharge: 2013-04-13 | Disposition: A | Discharge status: Home / Self Care | Payer: Self-pay | Attending: Emergency Medicine | Admitting: Emergency Medicine

## 2013-04-13 ENCOUNTER — Encounter (HOSPITAL_COMMUNITY): Payer: Self-pay | Admitting: Emergency Medicine

## 2013-04-13 DIAGNOSIS — Z8659 Personal history of other mental and behavioral disorders: Secondary | ICD-10-CM | POA: Insufficient documentation

## 2013-04-13 DIAGNOSIS — F17200 Nicotine dependence, unspecified, uncomplicated: Secondary | ICD-10-CM | POA: Insufficient documentation

## 2013-04-13 DIAGNOSIS — Z79899 Other long term (current) drug therapy: Secondary | ICD-10-CM | POA: Insufficient documentation

## 2013-04-13 DIAGNOSIS — E079 Disorder of thyroid, unspecified: Secondary | ICD-10-CM | POA: Insufficient documentation

## 2013-04-13 DIAGNOSIS — W19XXXA Unspecified fall, initial encounter: Secondary | ICD-10-CM | POA: Insufficient documentation

## 2013-04-13 DIAGNOSIS — Y939 Activity, unspecified: Secondary | ICD-10-CM | POA: Insufficient documentation

## 2013-04-13 DIAGNOSIS — Y929 Unspecified place or not applicable: Secondary | ICD-10-CM | POA: Insufficient documentation

## 2013-04-13 DIAGNOSIS — S7001XA Contusion of right hip, initial encounter: Secondary | ICD-10-CM

## 2013-04-13 DIAGNOSIS — Z880 Allergy status to penicillin: Secondary | ICD-10-CM | POA: Insufficient documentation

## 2013-04-13 DIAGNOSIS — IMO0002 Reserved for concepts with insufficient information to code with codable children: Secondary | ICD-10-CM | POA: Insufficient documentation

## 2013-04-13 DIAGNOSIS — S7000XA Contusion of unspecified hip, initial encounter: Secondary | ICD-10-CM | POA: Insufficient documentation

## 2013-04-13 DIAGNOSIS — M549 Dorsalgia, unspecified: Secondary | ICD-10-CM

## 2013-04-13 MED ORDER — METHOCARBAMOL 500 MG PO TABS: 500.0000 mg | ORAL_TABLET | Freq: Two times a day (BID) | ORAL | Status: DC

## 2013-04-13 MED ORDER — HYDROCODONE-ACETAMINOPHEN 5-325 MG PO TABS: 2.0000 | ORAL_TABLET | ORAL | Status: DC | PRN

## 2013-04-13 MED ORDER — IBUPROFEN 800 MG PO TABS: 800.0000 mg | ORAL_TABLET | Freq: Three times a day (TID) | ORAL | Status: DC

## 2013-04-13 NOTE — ED Provider Notes (Signed)
Medical screening examination/treatment/procedure(s) were performed by non-physician practitioner and as supervising physician I was immediately available for consultation/collaboration.  Deannah Rossi L Nadirah Socorro, MD 04/13/13 1539 

## 2013-04-13 NOTE — ED Notes (Signed)
Pt c/o left hip and back pain since falling on Tuesday.

## 2013-04-13 NOTE — Discharge Instructions (Signed)
Back Pain, Adult °Low back pain is very common. About 1 in 5 people have back pain. The cause of low back pain is rarely dangerous. The pain often gets better over time. About half of people with a sudden onset of back pain feel better in just 2 weeks. About 8 in 10 people feel better by 6 weeks.  °CAUSES °Some common causes of back pain include: °· Strain of the muscles or ligaments supporting the spine. °· Wear and tear (degeneration) of the spinal discs. °· Arthritis. °· Direct injury to the back. °DIAGNOSIS °Most of the time, the direct cause of low back pain is not known. However, back pain can be treated effectively even when the exact cause of the pain is unknown. Answering your caregiver's questions about your overall health and symptoms is one of the most accurate ways to make sure the cause of your pain is not dangerous. If your caregiver needs more information, he or she may order lab work or imaging tests (X-rays or MRIs). However, even if imaging tests show changes in your back, this usually does not require surgery. °HOME CARE INSTRUCTIONS °For many people, back pain returns. Since low back pain is rarely dangerous, it is often a condition that people can learn to manage on their own.  °· Remain active. It is stressful on the back to sit or stand in one place. Do not sit, drive, or stand in one place for more than 30 minutes at a time. Take short walks on level surfaces as soon as pain allows. Try to increase the length of time you walk each day. °· Do not stay in bed. Resting more than 1 or 2 days can delay your recovery. °· Do not avoid exercise or work. Your body is made to move. It is not dangerous to be active, even though your back may hurt. Your back will likely heal faster if you return to being active before your pain is gone. °· Pay attention to your body when you  bend and lift. Many people have less discomfort when lifting if they bend their knees, keep the load close to their bodies, and  avoid twisting. Often, the most comfortable positions are those that put less stress on your recovering back. °· Find a comfortable position to sleep. Use a firm mattress and lie on your side with your knees slightly bent. If you lie on your back, put a pillow under your knees. °· Only take over-the-counter or prescription medicines as directed by your caregiver. Over-the-counter medicines to reduce pain and inflammation are often the most helpful. Your caregiver may prescribe muscle relaxant drugs. These medicines help dull your pain so you can more quickly return to your normal activities and healthy exercise. °· Put ice on the injured area. °· Put ice in a plastic bag. °· Place a towel between your skin and the bag. °· Leave the ice on for 15-20 minutes, 03-04 times a day for the first 2 to 3 days. After that, ice and heat may be alternated to reduce pain and spasms. °· Ask your caregiver about trying back exercises and gentle massage. This may be of some benefit. °· Avoid feeling anxious or stressed. Stress increases muscle tension and can worsen back pain. It is important to recognize when you are anxious or stressed and learn ways to manage it. Exercise is a great option. °SEEK MEDICAL CARE IF: °· You have pain that is not relieved with rest or medicine. °· You have pain that does not improve in 1 week. °· You have new symptoms. °· You are generally not feeling well. °SEEK   IMMEDIATE MEDICAL CARE IF:  °· You have pain that radiates from your back into your legs. °· You develop new bowel or bladder control problems. °· You have unusual weakness or numbness in your arms or legs. °· You develop nausea or vomiting. °· You develop abdominal pain. °· You feel faint. °Document Released: 03/28/2005 Document Revised: 09/27/2011 Document Reviewed: 08/16/2010 °ExitCare® Patient Information ©2014 [employer]. ° °Contusion °A contusion is a deep bruise. Contusions are the result of an injury that caused bleeding under the  skin. The contusion may turn blue, purple, or yellow. Minor injuries will give you a painless contusion, but more severe contusions may stay painful and swollen for a few weeks.  °CAUSES  °A contusion is usually caused by a blow, trauma, or direct force to an area of the body. °SYMPTOMS  °· Swelling and redness of the injured area. °· Bruising of the injured area. °· Tenderness and soreness of the injured area. °· Pain. °DIAGNOSIS  °The diagnosis can be made by taking a history and physical exam. An X-ray, CT scan, or MRI may be needed to determine if there were any associated injuries, such as fractures. °TREATMENT  °Specific treatment will depend on what area of the body was injured. In general, the best treatment for a contusion is resting, icing, elevating, and applying cold compresses to the injured area. Over-the-counter medicines may also be recommended for pain control. Ask your caregiver what the best treatment is for your contusion. °HOME CARE INSTRUCTIONS  °· Put ice on the injured area. °· Put ice in a plastic bag. °· Place a towel between your skin and the bag. °· Leave the ice on for 15-20 minutes, 03-04 times a day. °· Only take over-the-counter or prescription medicines for pain, discomfort, or fever as directed by your caregiver. Your caregiver may recommend avoiding anti-inflammatory medicines (aspirin, ibuprofen, and naproxen) for 48 hours because these medicines may increase bruising. °· Rest the injured area. °· If possible, elevate the injured area to reduce swelling. °SEEK IMMEDIATE MEDICAL CARE IF:  °· You have increased bruising or swelling. °· You have pain that is getting worse. °· Your swelling or pain is not relieved with medicines. °MAKE SURE YOU:  °· Understand these instructions. °· Will watch your condition. °· Will get help right away if you are not doing well or get worse. °Document Released: 01/05/2005 Document Revised: 06/20/2011 Document Reviewed: 01/31/2011 °ExitCare® Patient  Information ©2014 [employer]. ° °

## 2013-04-13 NOTE — ED Provider Notes (Signed)
CSN: [id number]     Arrival date & time 04/13/13  1058 History   First MD Initiated Contact with Patient 04/13/13 1121     Chief Complaint  Patient presents with  . Back Pain  . Hip Pain   (Consider location/radiation/quality/duration/timing/severity/associated sxs/prior Treatment) Patient is a 28 y.o. female presenting with back pain and hip pain. The history is provided by the patient. No language interpreter was used.  Back Pain Location:  Lumbar spine Quality:  Aching Radiates to:  R posterior upper leg Pain severity:  Moderate Pain is:  Same all the time Duration:  4 days Timing:  Constant Progression:  Worsening Chronicity:  New Context: not emotional stress   Relieved by:  Nothing Worsened by:  Nothing tried Ineffective treatments:  None tried Associated symptoms: no dysuria, no numbness, no pelvic pain and no weakness   Hip Pain Pertinent negatives include no numbness or weakness.  Pt fell 4 days ago and hot back and right side.   Pt complains of continued pain.  Pain with walking  Past Medical History  Diagnosis Date  . Degenerative disc disease   . Thyroid disease   . Panic attacks    Past Surgical History  Procedure Laterality Date  . Tubal ligation     Family History  Problem Relation Age of Onset  . Cancer Mother   . Cancer Father   . Heart failure Other   . COPD Other   . Stroke Other    History  Substance Use Topics  . Smoking status: Current Every Day Smoker -- 1.00 packs/day for 14 years    Types: Cigarettes  . Smokeless tobacco: Never Used  . Alcohol Use: Yes     Comment: occas   OB History   Grav Para Term Preterm Abortions TAB SAB Ect Mult Living   8 2 2  6  6   2      Review of Systems  Genitourinary: Negative for dysuria and pelvic pain.  Musculoskeletal: Positive for back pain.  Neurological: Negative for weakness and numbness.  All other systems reviewed and are negative.    Allergies  Amoxicillin and Penicillins  Home  Medications   Current Outpatient Rx  Name  Route  Sig  Dispense  Refill  . acetaminophen (TYLENOL) 500 MG tablet   Oral   Take 1,000 mg by mouth every 6 (six) hours as needed for pain.          Marland Kitchen ibuprofen (ADVIL,MOTRIN) 200 MG tablet   Oral   Take 800 mg by mouth every 6 (six) hours as needed for pain.         Marland Kitchen levothyroxine (SYNTHROID, LEVOTHROID) 175 MCG tablet   Oral   Take 175 mcg by mouth daily.         . promethazine (PHENERGAN) 25 MG tablet   Oral   Take 1 tablet (25 mg total) by mouth every 6 (six) hours as needed for nausea or vomiting.   20 tablet   0    BP 126/74  Pulse 91  Temp(Src) 97.9 F (36.6 C) (Oral)  Resp 16  Ht 5\' 8"  (1.727 m)  Wt 165 lb (74.844 kg)  BMI 25.09 kg/m2  SpO2 100%  LMP 04/05/2013 Physical Exam  Constitutional: She appears well-developed and well-nourished.  HENT:  Head: Normocephalic.  Right Ear: External ear normal.  Left Ear: External ear normal.  Eyes: Pupils are equal, round, and reactive to light.  Neck: Normal range of motion.  Cardiovascular:  Normal rate and normal heart sounds.   Pulmonary/Chest: Effort normal.  Abdominal: Soft.  Musculoskeletal: She exhibits tenderness.  Tender ls spine diffusely,   Tender hip nad thigh, no deformity.  Neurological: She is alert.  Skin: Skin is warm.    ED Course  Procedures (including critical care time) Labs Review Labs Reviewed - No data to display Imaging Review Dg Lumbar Spine Complete  04/13/2013   CLINICAL DATA:  Right hip pain and back pain.  Status post fall.  EXAM: LUMBAR SPINE - COMPLETE 4+ VIEW  COMPARISON:  10/04/2011  FINDINGS: There is no evidence of lumbar spine fracture. Alignment is normal. Intervertebral disc spaces are maintained.  IMPRESSION: Negative.   Electronically Signed   By: Elige KoHetal  Patel   On: 04/13/2013 11:42    EKG Interpretation   None       MDM   1. Contusion, hip, right, initial encounter   2. Back pain   xray no fracture.   I advised  iCe, rest,   Follow up with Dr. Romeo AppleHarrison if pain persist past one week    Elson AreasLeslie K Stellan Vick, PA-C 04/13/13 1205

## 2013-04-24 ENCOUNTER — Encounter (HOSPITAL_COMMUNITY): Payer: Self-pay | Admitting: Emergency Medicine

## 2013-04-24 ENCOUNTER — Emergency Department (HOSPITAL_COMMUNITY)
Admission: EM | Admit: 2013-04-24 | Discharge: 2013-04-25 | Disposition: A | Discharge status: Home / Self Care | Payer: Self-pay | Attending: Emergency Medicine | Admitting: Emergency Medicine

## 2013-04-24 ENCOUNTER — Emergency Department (HOSPITAL_COMMUNITY): Payer: Self-pay

## 2013-04-24 DIAGNOSIS — F17200 Nicotine dependence, unspecified, uncomplicated: Secondary | ICD-10-CM | POA: Insufficient documentation

## 2013-04-24 DIAGNOSIS — Y939 Activity, unspecified: Secondary | ICD-10-CM | POA: Insufficient documentation

## 2013-04-24 DIAGNOSIS — Z791 Long term (current) use of non-steroidal anti-inflammatories (NSAID): Secondary | ICD-10-CM | POA: Insufficient documentation

## 2013-04-24 DIAGNOSIS — Z880 Allergy status to penicillin: Secondary | ICD-10-CM | POA: Insufficient documentation

## 2013-04-24 DIAGNOSIS — Z8659 Personal history of other mental and behavioral disorders: Secondary | ICD-10-CM | POA: Insufficient documentation

## 2013-04-24 DIAGNOSIS — S5010XA Contusion of unspecified forearm, initial encounter: Secondary | ICD-10-CM | POA: Insufficient documentation

## 2013-04-24 DIAGNOSIS — Z8739 Personal history of other diseases of the musculoskeletal system and connective tissue: Secondary | ICD-10-CM | POA: Insufficient documentation

## 2013-04-24 DIAGNOSIS — W1809XA Striking against other object with subsequent fall, initial encounter: Secondary | ICD-10-CM | POA: Insufficient documentation

## 2013-04-24 DIAGNOSIS — E079 Disorder of thyroid, unspecified: Secondary | ICD-10-CM | POA: Insufficient documentation

## 2013-04-24 DIAGNOSIS — Y929 Unspecified place or not applicable: Secondary | ICD-10-CM | POA: Insufficient documentation

## 2013-04-24 DIAGNOSIS — Z79899 Other long term (current) drug therapy: Secondary | ICD-10-CM | POA: Insufficient documentation

## 2013-04-24 DIAGNOSIS — W010XXA Fall on same level from slipping, tripping and stumbling without subsequent striking against object, initial encounter: Secondary | ICD-10-CM | POA: Insufficient documentation

## 2013-04-24 DIAGNOSIS — S5012XA Contusion of left forearm, initial encounter: Secondary | ICD-10-CM

## 2013-04-24 MED ORDER — HYDROCODONE-ACETAMINOPHEN 5-325 MG PO TABS
1.0000 | ORAL_TABLET | Freq: Once | ORAL | Status: AC
  Administered 2013-04-25: 1 via ORAL
  Filled 2013-04-24: qty 1

## 2013-04-24 MED ORDER — IBUPROFEN 800 MG PO TABS
800.0000 mg | ORAL_TABLET | Freq: Once | ORAL | Status: AC
  Administered 2013-04-25: 800 mg via ORAL
  Filled 2013-04-24: qty 1

## 2013-04-24 NOTE — ED Notes (Signed)
Pt fell on ice this am and hit her left forearm on patio chair.

## 2013-04-24 NOTE — ED Provider Notes (Signed)
CSN: [id number]     Arrival date & time 04/24/13  2240 History   First MD Initiated Contact with Patient 04/24/13 2256     Chief Complaint  Patient presents with  . Fall  . Arm Pain   (Consider location/radiation/quality/duration/timing/severity/associated sxs/prior Treatment) HPI Comments: Kelly Richard is a 28 y.o. female who presents to the Emergency Department complaining of left forearm pain that began suddenly after a fall earlier today.  She states that she slipped and fell striking her forearm on a chair.  Pain to her forearm is worse with movement and improves with rest. She has taken tylenol w/o relief.  She denies neck pain, head injury, numbness or weakness of the extremity  Patient is a 28 y.o. female presenting with arm pain. The history is provided by the patient.  Arm Pain Associated symptoms include arthralgias and myalgias. Pertinent negatives include no chills, fever, headaches, joint swelling or neck pain.    Past Medical History  Diagnosis Date  . Degenerative disc disease   . Thyroid disease   . Panic attacks    Past Surgical History  Procedure Laterality Date  . Tubal ligation     Family History  Problem Relation Age of Onset  . Cancer Mother   . Cancer Father   . Heart failure Other   . COPD Other   . Stroke Other    History  Substance Use Topics  . Smoking status: Current Every Day Smoker -- 1.00 packs/day for 14 years    Types: Cigarettes  . Smokeless tobacco: Never Used  . Alcohol Use: Yes     Comment: occas   OB History   Grav Para Term Preterm Abortions TAB SAB Ect Mult Living   8 2 2  6  6   2      Review of Systems  Constitutional: Negative for fever and chills.  Genitourinary: Negative for dysuria and difficulty urinating.  Musculoskeletal: Positive for arthralgias and myalgias. Negative for back pain, joint swelling, neck pain and neck stiffness.       Localized tenderness of the mid left forearm  Skin: Negative for color change and  wound.  Neurological: Negative for dizziness, syncope, light-headedness and headaches.  All other systems reviewed and are negative.    Allergies  Amoxicillin and Penicillins  Home Medications   Current Outpatient Rx  Name  Route  Sig  Dispense  Refill  . acetaminophen (TYLENOL) 500 MG tablet   Oral   Take 1,000 mg by mouth every 6 (six) hours as needed for pain.          Marland Kitchen HYDROcodone-acetaminophen (NORCO/VICODIN) 5-325 MG per tablet   Oral   Take 2 tablets by mouth every 4 (four) hours as needed.   10 tablet   0   . ibuprofen (ADVIL,MOTRIN) 200 MG tablet   Oral   Take 800 mg by mouth every 6 (six) hours as needed for pain.         Marland Kitchen ibuprofen (ADVIL,MOTRIN) 800 MG tablet   Oral   Take 1 tablet (800 mg total) by mouth 3 (three) times daily.   21 tablet   0   . levothyroxine (SYNTHROID, LEVOTHROID) 175 MCG tablet   Oral   Take 175 mcg by mouth daily.         . methocarbamol (ROBAXIN) 500 MG tablet   Oral   Take 1 tablet (500 mg total) by mouth 2 (two) times daily.   20 tablet   0   .  promethazine (PHENERGAN) 25 MG tablet   Oral   Take 1 tablet (25 mg total) by mouth every 6 (six) hours as needed for nausea or vomiting.   20 tablet   0    BP 120/76  Pulse 91  Temp(Src) 98.4 F (36.9 C) (Oral)  Resp 24  Ht 5\' 8"  (1.727 m)  Wt 170 lb (77.111 kg)  BMI 25.85 kg/m2  SpO2 99%  LMP 04/11/2013 Physical Exam  Nursing note and vitals reviewed. Constitutional: She is oriented to person, place, and time. She appears well-developed and well-nourished. No distress.  HENT:  Head: Normocephalic and atraumatic.  Neck: Normal range of motion. Neck supple.  Cardiovascular: Normal rate, regular rhythm and normal heart sounds.   Pulmonary/Chest: Effort normal and breath sounds normal. She exhibits no tenderness.  Musculoskeletal: She exhibits edema and tenderness.       Left forearm: She exhibits tenderness and swelling. She exhibits no bony tenderness, no  deformity and no laceration.       Arms: Localized ttp of the dorsal mid forearm.  Large bruise present with mild STS.  Radial pulse is brisk, distal sensation intact.  CR< 2 sec.  No bony deformity.Left wrist, elbow and shoulder are NT.  Compartments soft.  Neurological: She is alert and oriented to person, place, and time. She exhibits normal muscle tone. Coordination normal.  Skin: Skin is warm and dry.    ED Course  Procedures (including critical care time) Labs Review Labs Reviewed - No data to display Imaging Review Dg Forearm Left  04/24/2013   CLINICAL DATA:  Fall with bruising and forearm pain.  EXAM: LEFT FOREARM - 2 VIEW  COMPARISON:  None.  FINDINGS: There is soft tissue swelling along the dorsal mid forearm. No underlying fracture. No bone malalignment.  IMPRESSION: Forearm contusion without underlying fracture.   Electronically Signed   By: Tiburcio PeaJonathan  Watts M.D.   On: 04/24/2013 23:13    EKG Interpretation   None       MDM    [facility]  X-rays reviewed.  Sling applied, pain improved, remains NV intact.  Agrees to symptomatic treatment and close orthopedic f/u if needed.  Pt appears stable for discharge.  Kateland Leisinger L. Daquann Merriott, PA-C 04/25/13 0008

## 2013-04-25 MED ORDER — IBUPROFEN 800 MG PO TABS: 800.0000 mg | ORAL_TABLET | Freq: Three times a day (TID) | ORAL | Status: DC

## 2013-04-25 MED ORDER — HYDROCODONE-ACETAMINOPHEN 5-325 MG PO TABS: ORAL_TABLET | ORAL | Status: DC

## 2013-04-25 NOTE — Discharge Instructions (Signed)
Contusion  A contusion is a deep bruise. Contusions happen when an injury causes bleeding under the skin. Signs of bruising include pain, puffiness (swelling), and discolored skin. The contusion may turn blue, purple, or yellow.  HOME CARE   · Put ice on the injured area.  · Put ice in a plastic bag.  · Place a towel between your skin and the bag.  · Leave the ice on for 15-20 minutes, 03-04 times a day.  · Only take medicine as told by your doctor.  · Rest the injured area.  · If possible, raise (elevate) the injured area to lessen puffiness.  GET HELP RIGHT AWAY IF:   · You have more bruising or puffiness.  · You have pain that is getting worse.  · Your puffiness or pain is not helped by medicine.  MAKE SURE YOU:   · Understand these instructions.  · Will watch your condition.  · Will get help right away if you are not doing well or get worse.  Document Released: 09/14/2007 Document Revised: 06/20/2011 Document Reviewed: 01/31/2011  ExitCare® Patient Information ©2014 [employer].

## 2013-04-25 NOTE — ED Provider Notes (Signed)
Medical screening examination/treatment/procedure(s) were performed by non-physician practitioner and as supervising physician I was immediately available for consultation/collaboration.  EKG Interpretation   None        Sunnie NielsenBrian Dinesh Ulysse, MD 04/25/13 [phone]

## 2013-04-25 NOTE — ED Notes (Signed)
Discharge instructions given and reviewed with patient.  Prescriptions given for Vicodin and Ibuprofen; effects and use explained for each.  Patient verbalized understanding of sedating effects of Vicodin and to take Ibuprofen with food.  Patient ambulatory with steady gait; discharged home in good condition.  Aunt to drive home.

## 2013-07-08 ENCOUNTER — Encounter (HOSPITAL_COMMUNITY): Payer: Self-pay | Admitting: Emergency Medicine

## 2013-07-08 ENCOUNTER — Emergency Department (HOSPITAL_COMMUNITY)
Admission: EM | Admit: 2013-07-08 | Discharge: 2013-07-08 | Disposition: A | Discharge status: Home / Self Care | Payer: Self-pay | Attending: Emergency Medicine | Admitting: Emergency Medicine

## 2013-07-08 ENCOUNTER — Emergency Department (HOSPITAL_COMMUNITY): Payer: Self-pay

## 2013-07-08 DIAGNOSIS — Z8739 Personal history of other diseases of the musculoskeletal system and connective tissue: Secondary | ICD-10-CM | POA: Insufficient documentation

## 2013-07-08 DIAGNOSIS — Z8639 Personal history of other endocrine, nutritional and metabolic disease: Secondary | ICD-10-CM | POA: Insufficient documentation

## 2013-07-08 DIAGNOSIS — Y9389 Activity, other specified: Secondary | ICD-10-CM | POA: Insufficient documentation

## 2013-07-08 DIAGNOSIS — S20229A Contusion of unspecified back wall of thorax, initial encounter: Secondary | ICD-10-CM | POA: Insufficient documentation

## 2013-07-08 DIAGNOSIS — Y929 Unspecified place or not applicable: Secondary | ICD-10-CM | POA: Insufficient documentation

## 2013-07-08 DIAGNOSIS — Z880 Allergy status to penicillin: Secondary | ICD-10-CM | POA: Insufficient documentation

## 2013-07-08 DIAGNOSIS — Z8659 Personal history of other mental and behavioral disorders: Secondary | ICD-10-CM | POA: Insufficient documentation

## 2013-07-08 DIAGNOSIS — W208XXA Other cause of strike by thrown, projected or falling object, initial encounter: Secondary | ICD-10-CM | POA: Insufficient documentation

## 2013-07-08 DIAGNOSIS — Z862 Personal history of diseases of the blood and blood-forming organs and certain disorders involving the immune mechanism: Secondary | ICD-10-CM | POA: Insufficient documentation

## 2013-07-08 DIAGNOSIS — F17200 Nicotine dependence, unspecified, uncomplicated: Secondary | ICD-10-CM | POA: Insufficient documentation

## 2013-07-08 MED ORDER — HYDROCODONE-ACETAMINOPHEN 5-325 MG PO TABS: 1.0000 | ORAL_TABLET | ORAL | Status: DC | PRN

## 2013-07-08 MED ORDER — MORPHINE SULFATE 4 MG/ML IJ SOLN
8.0000 mg | Freq: Once | INTRAMUSCULAR | Status: AC
  Administered 2013-07-08: 8 mg via INTRAMUSCULAR
  Filled 2013-07-08: qty 2

## 2013-07-08 MED ORDER — MELOXICAM 7.5 MG PO TABS: ORAL_TABLET | ORAL | Status: DC

## 2013-07-08 MED ORDER — PROMETHAZINE HCL 12.5 MG PO TABS
12.5000 mg | ORAL_TABLET | Freq: Once | ORAL | Status: AC
  Administered 2013-07-08: 12.5 mg via ORAL
  Filled 2013-07-08: qty 1

## 2013-07-08 MED ORDER — BACLOFEN 10 MG PO TABS: ORAL_TABLET | ORAL | Status: DC

## 2013-07-08 MED ORDER — METHOCARBAMOL 500 MG PO TABS
1000.0000 mg | ORAL_TABLET | Freq: Once | ORAL | Status: AC
  Administered 2013-07-08: 1000 mg via ORAL
  Filled 2013-07-08: qty 2

## 2013-07-08 NOTE — ED Notes (Signed)
Pt reports was cleaning behind a dresser this am and it fell over on her.  C/O pain in lower back radiating down left leg.

## 2013-07-08 NOTE — Discharge Instructions (Signed)
The x-rays of your back and your sacrum/coccyx are negative for fracture or dislocation. Suspect you have a contusion to the back. Please rest your back is much as possible. Please use baclofen and Mobic daily. Please use Norco for more severe pain. Norco and baclofen may cause drowsiness, please use with caution. Please see Dr. Romeo AppleHarrison for additional evaluation if not improving. Contusion A contusion is a deep bruise. Contusions happen when an injury causes bleeding under the skin. Signs of bruising include pain, puffiness (swelling), and discolored skin. The contusion may turn blue, purple, or yellow. HOME CARE   Put ice on the injured area.  Put ice in a plastic bag.  Place a towel between your skin and the bag.  Leave the ice on for 15-20 minutes, 03-04 times a day.  Only take medicine as told by your doctor.  Rest the injured area.  If possible, raise (elevate) the injured area to lessen puffiness. GET HELP RIGHT AWAY IF:   You have more bruising or puffiness.  You have pain that is getting worse.  Your puffiness or pain is not helped by medicine. MAKE SURE YOU:   Understand these instructions.  Will watch your condition.  Will get help right away if you are not doing well or get worse. Document Released: 09/14/2007 Document Revised: 06/20/2011 Document Reviewed: 01/31/2011 [facility] Patient Information 2014 [employer] [place].

## 2013-07-08 NOTE — ED Provider Notes (Signed)
CSN: [id number]     Arrival date & time 07/08/13  1144 History   None   This chart was scribed for non-physician practitioner, Ivery Quale, working with Gray Bernhardt, MD by Marica Otter, ED Scribe. This patient was seen in room APFT20/APFT20 and the patient's care was started at 3:00 PM.  Chief Complaint  Patient presents with  . Back Pain    The history is provided by the patient. No language interpreter was used.   HPI Comments: Kelly Richard is a 28 y.o. female, with a history of degenerative disc, disease who presents to the Emergency Department complaining of lower back pain radiating down her left leg, onset this morning when a dresser fell on her back. Pt does not have any history of prior injuries to the area. Pt reports that the pain in her back and leg intensifies when she puts pressure on her left leg. Pt describes the pain as severe.   Past Medical History  Diagnosis Date  . Degenerative disc disease   . Thyroid disease   . Panic attacks    Past Surgical History  Procedure Laterality Date  . Tubal ligation     Family History  Problem Relation Age of Onset  . Cancer Mother   . Cancer Father   . Heart failure Other   . COPD Other   . Stroke Other    History  Substance Use Topics  . Smoking status: Current Every Day Smoker -- 1.00 packs/day for 14 years    Types: Cigarettes  . Smokeless tobacco: Never Used  . Alcohol Use: Yes     Comment: occas   OB History   Grav Para Term Preterm Abortions TAB SAB Ect Mult Living   8 2 2  6  6   2      Review of Systems  Constitutional:       Tearful   Musculoskeletal:       Lower back pain radiating to left leg.     A complete 10 system review of systems was obtained and all systems are negative except as noted in the HPI and PMH.    Allergies  Amoxicillin and Penicillins  Home Medications   Current Outpatient Rx  Name  Route  Sig  Dispense  Refill  . HYDROcodone-acetaminophen (NORCO/VICODIN) 5-325 MG per  tablet      Take one-two tabs po q 4-6 hrs prn pain   10 tablet   0    BP 112/58  Pulse 98  Temp(Src) 97.5 F (36.4 C) (Oral)  Resp 18  Ht 5\' 7"  (1.702 m)  Wt 170 lb (77.111 kg)  BMI 26.62 kg/m2  SpO2 97%  LMP 07/01/2013 Physical Exam  Nursing note and vitals reviewed. Constitutional: She is oriented to person, place, and time. She appears well-developed and well-nourished. No distress.  HENT:  Head: Normocephalic and atraumatic.  trachea is midline, no bruise to the neck  Eyes: EOM are normal.  Neck: Neck supple. No tracheal deviation present.  Cardiovascular: Normal rate and regular rhythm.  Exam reveals no friction rub.   Pulmonary/Chest: Effort normal. No respiratory distress.  Symmetrical rise and fall of chest. Lungs clear.    Abdominal: Soft. Bowel sounds are normal.  Musculoskeletal: Normal range of motion.  no palpable step off the thoracic, no hematoma of the thoracic area Paraspinal tenderness in the lumbar No pain or hematoma of the pelvis No edema in the lower extremeties. Achilles intact bilaterally. Dorsalis pedis 2+ bilaterally.  Neurological: She is alert and oriented to person, place, and time.  Skin: Skin is warm and dry.  Psychiatric: She has a normal mood and affect. Her behavior is normal.    ED Course  Procedures (including critical care time) DIAGNOSTIC STUDIES: Oxygen Saturation is 97% on RA, adequate by my interpretation.   Pain improved after IM Morphine. COORDINATION OF CARE: 3:06 PM-Discussed treatment plan which includes imaging and meds with pt at bedside and pt agreed to plan.   Labs Review Labs Reviewed - No data to display Imaging Review No results found.   EKG Interpretation None      MDM X-ray of the lumbar spine is negative. X-ray of the sacrum and coccyx is negative. Patient advised of the findings. Patient advised to use an ice pack to the lower back. She's given a prescription for baclofen, Mobic, and Norco. Patient  advised to see Dr. Romeo AppleHarrison for additional evaluation if pain not improving.    Final diagnoses:  None    *I have reviewed nursing notes, vital signs, and all appropriate lab and imaging results for this patient.** **I personally performed the services described in this documentation, which was scribed in my presence. The recorded information has been reviewed and is accurate.Kathie Dike*   Tauni Sanks M Kirtis Challis, PA-C 07/08/13 [phone]

## 2013-07-08 NOTE — ED Notes (Signed)
Pain low back with radiation down lt leg.Onset app 9 am when cleaning behind dresser and it fell on her. No visible trauma.

## 2013-07-09 NOTE — ED Provider Notes (Signed)
Medical screening examination/treatment/procedure(s) were performed by non-physician practitioner and as supervising physician I was immediately available for consultation/collaboration.   EKG Interpretation None        Charles B. Sheldon, MD 07/09/13 0718 

## 2013-08-05 ENCOUNTER — Other Ambulatory Visit: Payer: Self-pay | Admitting: Obstetrics and Gynecology

## 2013-08-05 DIAGNOSIS — Z1231 Encounter for screening mammogram for malignant neoplasm of breast: Secondary | ICD-10-CM

## 2013-08-27 ENCOUNTER — Ambulatory Visit (HOSPITAL_COMMUNITY): Payer: Self-pay

## 2013-08-27 ENCOUNTER — Ambulatory Visit (HOSPITAL_COMMUNITY): Payer: Self-pay | Attending: Obstetrics and Gynecology

## 2014-02-10 ENCOUNTER — Encounter (HOSPITAL_COMMUNITY): Payer: Self-pay | Admitting: Emergency Medicine

## 2014-02-18 ENCOUNTER — Encounter: Payer: Self-pay | Admitting: *Deleted

## 2014-02-18 ENCOUNTER — Other Ambulatory Visit: Payer: Self-pay | Admitting: Obstetrics and Gynecology

## 2014-04-20 ENCOUNTER — Emergency Department (HOSPITAL_COMMUNITY)
Admission: EM | Admit: 2014-04-20 | Discharge: 2014-04-20 | Disposition: A | Discharge status: Home / Self Care | Payer: Self-pay | Attending: Emergency Medicine | Admitting: Emergency Medicine

## 2014-04-20 ENCOUNTER — Encounter (HOSPITAL_COMMUNITY): Payer: Self-pay

## 2014-04-20 DIAGNOSIS — Z880 Allergy status to penicillin: Secondary | ICD-10-CM | POA: Insufficient documentation

## 2014-04-20 DIAGNOSIS — R630 Anorexia: Secondary | ICD-10-CM | POA: Insufficient documentation

## 2014-04-20 DIAGNOSIS — Z8739 Personal history of other diseases of the musculoskeletal system and connective tissue: Secondary | ICD-10-CM | POA: Insufficient documentation

## 2014-04-20 DIAGNOSIS — Z720 Tobacco use: Secondary | ICD-10-CM | POA: Insufficient documentation

## 2014-04-20 DIAGNOSIS — Z8639 Personal history of other endocrine, nutritional and metabolic disease: Secondary | ICD-10-CM | POA: Insufficient documentation

## 2014-04-20 DIAGNOSIS — J069 Acute upper respiratory infection, unspecified: Secondary | ICD-10-CM | POA: Insufficient documentation

## 2014-04-20 DIAGNOSIS — R51 Headache: Secondary | ICD-10-CM | POA: Insufficient documentation

## 2014-04-20 DIAGNOSIS — Z8659 Personal history of other mental and behavioral disorders: Secondary | ICD-10-CM | POA: Insufficient documentation

## 2014-04-20 DIAGNOSIS — Z791 Long term (current) use of non-steroidal anti-inflammatories (NSAID): Secondary | ICD-10-CM | POA: Insufficient documentation

## 2014-04-20 DIAGNOSIS — J029 Acute pharyngitis, unspecified: Secondary | ICD-10-CM

## 2014-04-20 MED ORDER — HYDROCOD POLST-CHLORPHEN POLST 10-8 MG/5ML PO LQCR: 5.0000 mL | Freq: Two times a day (BID) | ORAL | Status: DC

## 2014-04-20 MED ORDER — IBUPROFEN 800 MG PO TABS: 800.0000 mg | ORAL_TABLET | Freq: Three times a day (TID) | ORAL | Status: DC

## 2014-04-20 NOTE — ED Provider Notes (Signed)
CSN: [id number]     Arrival date & time 04/20/14  [id number] History  This chart was scribed for non-physician practitioner Pauline Ausammy Providence Stivers, PA-C working with Glynn OctaveStephen Rancour, MD by Murriel HopperAlec Bankhead, ED Scribe. This patient was seen in room APFT24/APFT24 and the patient's care was started at 11:44 AM.  Chief Complaint  Patient presents with  . Sore Throat  . URI    The history is provided by the patient. No language interpreter was used.    HPI Comments: Kelly Richard is a 29 y.o. female who presents to the Emergency Department complaining of a constant, worsening sore throat with associated chills, congestion, body aches, and cough that have been present for two days. Pt states that her sore throat came before her other symptoms. Pt states that her cough has been dry and non-productive. Pt states that her sore throat "feels like someone is stabbing me in the throat". Pt has taken robitussin and Advil for her symptoms with little relief. Pt states that her appetite has diminished.  Pt denies vomiting, abdominal pain, dysuria, shortness of breath or chest pain.  She has not been taking any medications for her symptoms  Past Medical History  Diagnosis Date  . Degenerative disc disease   . Thyroid disease   . Panic attacks    Past Surgical History  Procedure Laterality Date  . Tubal ligation     Family History  Problem Relation Age of Onset  . Cancer Mother   . Cancer Father   . Heart failure Other   . COPD Other   . Stroke Other    History  Substance Use Topics  . Smoking status: Current Every Day Smoker -- 1.00 packs/day for 14 years    Types: Cigarettes  . Smokeless tobacco: Never Used  . Alcohol Use: Yes     Comment: occas   OB History    Gravida Para Term Preterm AB TAB SAB Ectopic Multiple Living   8 2 2  6  6   2      Review of Systems  Constitutional: Positive for chills and appetite change.  HENT: Positive for congestion and sore throat.   Respiratory: Positive for cough.  Negative for shortness of breath and wheezing.   Cardiovascular: Negative for chest pain.  Gastrointestinal: Positive for nausea. Negative for vomiting and abdominal pain.  Musculoskeletal: Positive for myalgias. Negative for neck pain and neck stiffness.  Skin: Negative for rash.  Neurological: Positive for headaches. Negative for dizziness, seizures, weakness and numbness.  All other systems reviewed and are negative.     Allergies  Amoxicillin and Penicillins  Home Medications   Prior to Admission medications   Medication Sig Start Date End Date Taking? Authorizing Provider  baclofen (LIORESAL) 10 MG tablet 2 po tid with food Patient not taking: Reported on 04/20/2014 07/08/13   Kathie DikeHobson M Bryant, PA-C  HYDROcodone-acetaminophen (NORCO) 5-325 MG per tablet Take 1 tablet by mouth every 4 (four) hours as needed. Patient not taking: Reported on 04/20/2014 07/08/13   Kathie DikeHobson M Bryant, PA-C  HYDROcodone-acetaminophen (NORCO/VICODIN) 5-325 MG per tablet Take one-two tabs po q 4-6 hrs prn pain Patient not taking: Reported on 04/20/2014 04/25/13   Eziah Negro L. Siera Beyersdorf, PA-C  meloxicam (MOBIC) 7.5 MG tablet 1 po bid with food Patient not taking: Reported on 04/20/2014 07/08/13   Kathie DikeHobson M Bryant, PA-C   BP 122/72 mmHg  Pulse 105  Temp(Src) 99.2 F (37.3 C) (Core (Comment))  Resp 20  Ht 5\' 7"  (1.702 m)  Wt 173 lb (78.472 kg)  BMI 27.09 kg/m2  SpO2 100%  LMP 03/30/2014 Physical Exam  Constitutional: She is oriented to person, place, and time. She appears well-developed and well-nourished.  HENT:  Head: Normocephalic and atraumatic.  Erythema and mild edema of the bilateral tonsils No exudates Uvula midline TMs normal  Neck: Full passive range of motion without pain and phonation normal. Neck supple. No Kernig's sign noted.  Cardiovascular: Normal rate, regular rhythm, normal heart sounds and intact distal pulses.   No murmur heard. Pulmonary/Chest: Effort normal and breath sounds normal. No  respiratory distress. She has no wheezes. She has no rales.  Abdominal: Soft. She exhibits no distension. There is no tenderness. There is no rebound and no guarding.  Musculoskeletal: Normal range of motion.  Lymphadenopathy:    She has cervical adenopathy.  Neurological: She is alert and oriented to person, place, and time. She exhibits normal muscle tone. Coordination normal.  Skin: Skin is warm and dry. No rash noted.  Psychiatric: She has a normal mood and affect.  Nursing note and vitals reviewed.   ED Course  Procedures (including critical care time)  DIAGNOSTIC STUDIES: Oxygen Saturation is 100% on RA, normal by my interpretation.    COORDINATION OF CARE: 11:52 AM Discussed treatment plan with pt at bedside and pt agreed to plan.   Labs Review Labs Reviewed - No data to display  Imaging Review No results found.   EKG Interpretation None      MDM   Final diagnoses:  URI (upper respiratory infection)  Pharyngitis    Pt is well appearing.  Non-toxic.  Airway patent w/o evidence for PTA.  VSS.  Sx's appear c/w viral process.  Pt agrees to symptomatic tx , appears stable for d/c.    I personally performed the services described in this documentation, which was scribed in my presence. The recorded information has been reviewed and is accurate.    Tacy Chavis L. Trisha Mangle, PA-C 04/22/14 1611  Glynn Octave, MD 04/23/14 [phone]

## 2014-04-20 NOTE — Discharge Instructions (Signed)

## 2014-04-20 NOTE — ED Notes (Signed)
Pt c/o cough, fever, sore throat, and headache since Friday.

## 2014-04-21 ENCOUNTER — Emergency Department (HOSPITAL_COMMUNITY)
Admission: EM | Admit: 2014-04-21 | Discharge: 2014-04-21 | Disposition: A | Discharge status: Home / Self Care | Payer: Self-pay | Attending: Emergency Medicine | Admitting: Emergency Medicine

## 2014-04-21 ENCOUNTER — Encounter (HOSPITAL_COMMUNITY): Payer: Self-pay

## 2014-04-21 DIAGNOSIS — Z8659 Personal history of other mental and behavioral disorders: Secondary | ICD-10-CM | POA: Insufficient documentation

## 2014-04-21 DIAGNOSIS — K1379 Other lesions of oral mucosa: Secondary | ICD-10-CM

## 2014-04-21 DIAGNOSIS — Z8639 Personal history of other endocrine, nutritional and metabolic disease: Secondary | ICD-10-CM | POA: Insufficient documentation

## 2014-04-21 DIAGNOSIS — Z880 Allergy status to penicillin: Secondary | ICD-10-CM | POA: Insufficient documentation

## 2014-04-21 DIAGNOSIS — Z720 Tobacco use: Secondary | ICD-10-CM | POA: Insufficient documentation

## 2014-04-21 DIAGNOSIS — Z8739 Personal history of other diseases of the musculoskeletal system and connective tissue: Secondary | ICD-10-CM | POA: Insufficient documentation

## 2014-04-21 DIAGNOSIS — Z79899 Other long term (current) drug therapy: Secondary | ICD-10-CM | POA: Insufficient documentation

## 2014-04-21 DIAGNOSIS — J029 Acute pharyngitis, unspecified: Secondary | ICD-10-CM | POA: Insufficient documentation

## 2014-04-21 LAB — RAPID STREP SCREEN (MED CTR MEBANE ONLY): STREPTOCOCCUS, GROUP A SCREEN (DIRECT): NEGATIVE

## 2014-04-21 MED ORDER — DEXAMETHASONE SODIUM PHOSPHATE 10 MG/ML IJ SOLN
10.0000 mg | Freq: Once | INTRAMUSCULAR | Status: AC
  Administered 2014-04-21: 10 mg via INTRAMUSCULAR
  Filled 2014-04-21: qty 1

## 2014-04-21 NOTE — ED Provider Notes (Signed)
CSN: [id number]     Arrival date & time 04/21/14  1947 History  This chart was scribed for Antony Madura, PA-C, working with Suzi Roots, MD by Elon Spanner, ED Scribe. This patient was seen in room WTR5/WTR5 and the patient's care was started at 8:27 PM.   Chief Complaint  Patient presents with  . Sore Throat   The history is provided by the patient. No language interpreter was used.   HPI Comments: Kelly Richard is a 29 y.o. female who presents to the Emergency Department complaining of a worsening sore throat aggravated by swallowing onset 3 days ago.  Patient states she has observed her uvula has become more swollen since yesterday.  Patient reports she was seen at [facility] yesterday and diagnosed with a URI.  She was prescribed Tussionex and ibuprofen but denies relief.  She has also taken OTC medications without relief.  Patient denies sick contacts.  Patient denies nasal congestion, rhinorrhea, trouble swallowing, SOB, drooling, fever.   Past Medical History  Diagnosis Date  . Degenerative disc disease   . Thyroid disease   . Panic attacks    Past Surgical History  Procedure Laterality Date  . Tubal ligation     Family History  Problem Relation Age of Onset  . Cancer Mother   . Cancer Father   . Heart failure Other   . COPD Other   . Stroke Other    History  Substance Use Topics  . Smoking status: Current Every Day Smoker -- 1.00 packs/day for 14 years    Types: Cigarettes  . Smokeless tobacco: Never Used  . Alcohol Use: Yes     Comment: occas   OB History    Gravida Para Term Preterm AB TAB SAB Ectopic Multiple Living   Review of Systems  HENT: Positive for sore throat.   All other systems reviewed and are negative.   Allergies  Amoxicillin and Penicillins  Home Medications   Prior to Admission medications   Medication Sig Start Date End Date Taking? Authorizing Provider  baclofen (LIORESAL) 10 MG tablet 2 po tid with  food Patient not taking: Reported on 04/20/2014 07/08/13   Kathie Dike, PA-C  chlorpheniramine-HYDROcodone [facility] PENNKINETIC ER) 10-8 MG/5ML LQCR Take 5 mLs by mouth 2 (two) times daily. 04/20/14   Tammy L. Triplett, PA-C  HYDROcodone-acetaminophen (NORCO) 5-325 MG per tablet Take 1 tablet by mouth every 4 (four) hours as needed. Patient not taking: Reported on 04/20/2014 07/08/13   Kathie Dike, PA-C  HYDROcodone-acetaminophen (NORCO/VICODIN) 5-325 MG per tablet Take one-two tabs po q 4-6 hrs prn pain Patient not taking: Reported on 04/20/2014 04/25/13   Tammy L. Triplett, PA-C  ibuprofen (ADVIL,MOTRIN) 800 MG tablet Take 1 tablet (800 mg total) by mouth 3 (three) times daily. 04/20/14   Tammy L. Triplett, PA-C  meloxicam (MOBIC) 7.5 MG tablet 1 po bid with food Patient not taking: Reported on 04/20/2014 07/08/13   Kathie Dike, PA-C   BP 117/59 mmHg  Pulse 80  Temp(Src) 98.2 F (36.8 C) (Oral)  Resp 22  Ht  (1.702 m)  Wt 173 lb (78.472 kg)  BMI 27.09 kg/m2  SpO2 97%  LMP 03/30/2014   Physical Exam  Constitutional: She is oriented to person, place, and time. She appears well-developed and well-nourished. No distress.  Nontoxic/nonseptic appearing  HENT:  Head: Normocephalic and atraumatic.  Right Ear:  Tympanic membrane, external ear and ear canal normal.  Left Ear: Tympanic membrane, external ear and ear canal normal.  Nose: Nose normal.  Mouth/Throat: Uvula is midline and mucous membranes are normal. No trismus in the jaw. Uvula swelling present. Posterior oropharyngeal edema and posterior oropharyngeal erythema present. No oropharyngeal exudate or tonsillar abscesses.  Erythematous posterior oropharynx. There is mild swelling of the tonsils bilaterally. No tonsillar exudates. Uvula midline, erythematous, and edematous. Patient tolerating secretions without difficulty or drooling.  Eyes: Conjunctivae and EOM are normal. No scleral icterus.  Neck: Normal range of motion.   No nuchal rigidity or meningismus. Patient with anterior cervical lymphadenopathy b/l; tender on right  Pulmonary/Chest: Effort normal. No respiratory distress.  Respirations even and unlabored. Patient speaking in full sentences.  Musculoskeletal: Normal range of motion.  Lymphadenopathy:    She has cervical adenopathy.  Neurological: She is alert and oriented to person, place, and time. She exhibits normal muscle tone. Coordination normal.  Skin: Skin is warm and dry. No rash noted. She is not diaphoretic. No erythema. No pallor.  Psychiatric: She has a normal mood and affect. Her behavior is normal.  Nursing note and vitals reviewed.   ED Course  Procedures (including critical care time)  DIAGNOSTIC STUDIES: Oxygen Saturation is 97% on RA, normal by my interpretation.    COORDINATION OF CARE:  8:31 PM Will order rapid strep and Decadron.  Patient acknowledges and agrees with plan.    Labs Review Labs Reviewed - No data to display  Imaging Review No results found.   EKG Interpretation None       Medications  dexamethasone (DECADRON) injection 10 mg (10 mg Intramuscular Given 04/21/14 2037)    MDM   Final diagnoses:  Uvular edema  Pharyngitis    29 year old female presents to the emergency department for persistent sore throat. She has been using ibuprofen and Tussionex without improvement in her symptoms. Patient requesting strep screen. This was completed and found to be negative. 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 Patient tolerating secretions without difficulty. No evidence of airway compromise. Patient stable and appropriate for discharge. Have advised continued use of supportive measures and have referred to ENT should symptoms persist. Return precautions discussed and provided. Patient agreeable to plan with no unaddressed concerns.  I personally performed the services described in this  documentation, which was scribed in my presence. The recorded information has been reviewed and is accurate.   Filed Vitals:   04/21/14 1953  BP: 117/59  Pulse: 80  Temp: 98.2 F (36.8 C)  TempSrc: Oral  Resp: 22  Height: 5\' 7"  (1.702 m)  Weight: 173 lb (78.472 kg)  SpO2: 97%       Antony MaduraKelly Dream Harman, PA-C 04/21/14 2107  Suzi RootsKevin E Steinl, MD 04/22/14 1044

## 2014-04-21 NOTE — ED Notes (Signed)
Patient reports throat pain has been progressively worsening over the past few days.  [facility] and diagnosed with URI.  Reports difficulty swallowing.

## 2014-04-21 NOTE — Discharge Instructions (Signed)
Recommend salt water gargles. Continue with ibuprofen or tylenol as well as Tussinex as needed, as previously prescribed. Follow up with an ENT doctor if symptoms persist. Return to the ED as needed if symptoms worsen.  Pharyngitis Pharyngitis is redness, pain, and swelling (inflammation) of your pharynx.  CAUSES  Pharyngitis is usually caused by infection. Most of the time, these infections are from viruses (viral) and are part of a cold. However, sometimes pharyngitis is caused by bacteria (bacterial). Pharyngitis can also be caused by allergies. Viral pharyngitis may be spread from person to person by coughing, sneezing, and personal items or utensils (cups, forks, spoons, toothbrushes). Bacterial pharyngitis may be spread from person to person by more intimate contact, such as kissing.  SIGNS AND SYMPTOMS  Symptoms of pharyngitis include:   Sore throat.   Tiredness (fatigue).   Low-grade fever.   Headache.  Joint pain and muscle aches.  Skin rashes.  Swollen lymph nodes.  Plaque-like film on throat or tonsils (often seen with bacterial pharyngitis). DIAGNOSIS  Your health care provider will ask you questions about your illness and your symptoms. Your medical history, along with a physical exam, is often all that is needed to diagnose pharyngitis. Sometimes, a rapid strep test is done. Other lab tests may also be done, depending on the suspected cause.  TREATMENT  Viral pharyngitis will usually get better in 3-4 days without the use of medicine. Bacterial pharyngitis is treated with medicines that kill germs (antibiotics).  HOME CARE INSTRUCTIONS   Drink enough water and fluids to keep your urine clear or pale yellow.   Only take over-the-counter or prescription medicines as directed by your health care provider:   If you are prescribed antibiotics, make sure you finish them even if you start to feel better.   Do not take aspirin.   Get lots of rest.   Gargle with  8 oz of salt water ( tsp of salt per 1 qt of water) as often as every 1-2 hours to soothe your throat.   Throat lozenges (if you are not at risk for choking) or sprays may be used to soothe your throat. SEEK MEDICAL CARE IF:   You have large, tender lumps in your neck.  You have a rash.  You cough up green, yellow-brown, or bloody spit. SEEK IMMEDIATE MEDICAL CARE IF:   Your neck becomes stiff.  You drool or are unable to swallow liquids.  You vomit or are unable to keep medicines or liquids down.  You have severe pain that does not go away with the use of recommended medicines.  You have trouble breathing (not caused by a stuffy nose). MAKE SURE YOU:   Understand these instructions.  Will watch your condition.  Will get help right away if you are not doing well or get worse. Document Released: 03/28/2005 Document Revised: 01/16/2013 Document Reviewed: 12/03/2012 [facility] Patient Information 2015 [place], [place]. This information is not intended to replace advice given to you by your health care provider. Make sure you discuss any questions you have with your health care provider.  Salt Water Gargle This solution will help make your mouth and throat feel better. HOME CARE INSTRUCTIONS   Mix 1 teaspoon of salt in 8 ounces of warm water.  Gargle with this solution as much or often as you need or as directed. Swish and gargle gently if you have any sores or wounds in your mouth.  Do not swallow this mixture. Document Released: 12/31/2003 Document Revised: 06/20/2011 Document  Reviewed: 05/23/2008 ExitCare Patient Information 2015 [place], [place]. This information is not intended to replace advice given to you by your health care provider. Make sure you discuss any questions you have with your health care provider.

## 2014-04-23 LAB — CULTURE, GROUP A STREP

## 2014-12-02 ENCOUNTER — Emergency Department (HOSPITAL_COMMUNITY)
Admission: EM | Admit: 2014-12-02 | Discharge: 2014-12-02 | Disposition: A | Discharge status: Home / Self Care | Payer: Medicaid Other | Attending: Emergency Medicine | Admitting: Emergency Medicine

## 2014-12-02 ENCOUNTER — Encounter (HOSPITAL_COMMUNITY): Payer: Self-pay | Admitting: *Deleted

## 2014-12-02 ENCOUNTER — Emergency Department (HOSPITAL_COMMUNITY): Payer: Medicaid Other

## 2014-12-02 DIAGNOSIS — Z720 Tobacco use: Secondary | ICD-10-CM | POA: Diagnosis not present

## 2014-12-02 DIAGNOSIS — T148 Other injury of unspecified body region: Secondary | ICD-10-CM | POA: Insufficient documentation

## 2014-12-02 DIAGNOSIS — Z791 Long term (current) use of non-steroidal anti-inflammatories (NSAID): Secondary | ICD-10-CM | POA: Diagnosis not present

## 2014-12-02 DIAGNOSIS — Z880 Allergy status to penicillin: Secondary | ICD-10-CM | POA: Diagnosis not present

## 2014-12-02 DIAGNOSIS — Y9289 Other specified places as the place of occurrence of the external cause: Secondary | ICD-10-CM | POA: Insufficient documentation

## 2014-12-02 DIAGNOSIS — Y998 Other external cause status: Secondary | ICD-10-CM | POA: Insufficient documentation

## 2014-12-02 DIAGNOSIS — W108XXA Fall (on) (from) other stairs and steps, initial encounter: Secondary | ICD-10-CM | POA: Insufficient documentation

## 2014-12-02 DIAGNOSIS — Z79899 Other long term (current) drug therapy: Secondary | ICD-10-CM | POA: Diagnosis not present

## 2014-12-02 DIAGNOSIS — S79912A Unspecified injury of left hip, initial encounter: Secondary | ICD-10-CM | POA: Diagnosis not present

## 2014-12-02 DIAGNOSIS — S0990XA Unspecified injury of head, initial encounter: Secondary | ICD-10-CM | POA: Diagnosis not present

## 2014-12-02 DIAGNOSIS — Z8659 Personal history of other mental and behavioral disorders: Secondary | ICD-10-CM | POA: Insufficient documentation

## 2014-12-02 DIAGNOSIS — E079 Disorder of thyroid, unspecified: Secondary | ICD-10-CM | POA: Diagnosis not present

## 2014-12-02 DIAGNOSIS — Z8739 Personal history of other diseases of the musculoskeletal system and connective tissue: Secondary | ICD-10-CM | POA: Insufficient documentation

## 2014-12-02 DIAGNOSIS — T07XXXA Unspecified multiple injuries, initial encounter: Secondary | ICD-10-CM

## 2014-12-02 DIAGNOSIS — Y9389 Activity, other specified: Secondary | ICD-10-CM | POA: Diagnosis not present

## 2014-12-02 LAB — COMPREHENSIVE METABOLIC PANEL
ALK PHOS: 53 U/L (ref 38–126)
ALT: 14 U/L (ref 14–54)
ANION GAP: 8 (ref 5–15)
AST: 20 U/L (ref 15–41)
Albumin: 4.2 g/dL (ref 3.5–5.0)
BUN: 16 mg/dL (ref 6–20)
CO2: 25 mmol/L (ref 22–32)
CREATININE: 0.54 mg/dL (ref 0.44–1.00)
Calcium: 8.5 mg/dL — ABNORMAL LOW (ref 8.9–10.3)
Chloride: 104 mmol/L (ref 101–111)
Glucose, Bld: 102 mg/dL — ABNORMAL HIGH (ref 65–99)
Potassium: 4.2 mmol/L (ref 3.5–5.1)
SODIUM: 137 mmol/L (ref 135–145)
Total Bilirubin: 0.6 mg/dL (ref 0.3–1.2)
Total Protein: 7.3 g/dL (ref 6.5–8.1)

## 2014-12-02 LAB — CBC WITH DIFFERENTIAL/PLATELET
BASOS ABS: 0 10*3/uL (ref 0.0–0.1)
BASOS PCT: 0 % (ref 0–1)
EOS ABS: 0.1 10*3/uL (ref 0.0–0.7)
Eosinophils Relative: 0 % (ref 0–5)
HEMATOCRIT: 41.6 % (ref 36.0–46.0)
HEMOGLOBIN: 14 g/dL (ref 12.0–15.0)
Lymphocytes Relative: 17 % (ref 12–46)
Lymphs Abs: 2 10*3/uL (ref 0.7–4.0)
MCH: 31.3 pg (ref 26.0–34.0)
MCHC: 33.7 g/dL (ref 30.0–36.0)
MCV: 93.1 fL (ref 78.0–100.0)
MONOS PCT: 6 % (ref 3–12)
Monocytes Absolute: 0.7 10*3/uL (ref 0.1–1.0)
NEUTROS ABS: 8.6 10*3/uL — AB (ref 1.7–7.7)
NEUTROS PCT: 77 % (ref 43–77)
Platelets: 206 10*3/uL (ref 150–400)
RBC: 4.47 MIL/uL (ref 3.87–5.11)
RDW: 12.9 % (ref 11.5–15.5)
WBC: 11.4 10*3/uL — AB (ref 4.0–10.5)

## 2014-12-02 LAB — URINALYSIS, ROUTINE W REFLEX MICROSCOPIC
BILIRUBIN URINE: NEGATIVE
GLUCOSE, UA: NEGATIVE mg/dL
HGB URINE DIPSTICK: NEGATIVE
KETONES UR: NEGATIVE mg/dL
Leukocytes, UA: NEGATIVE
Nitrite: NEGATIVE
PH: 6.5 (ref 5.0–8.0)
Protein, ur: NEGATIVE mg/dL
SPECIFIC GRAVITY, URINE: 1.02 (ref 1.005–1.030)
Urobilinogen, UA: 0.2 mg/dL (ref 0.0–1.0)

## 2014-12-02 LAB — RAPID URINE DRUG SCREEN, HOSP PERFORMED
AMPHETAMINES: NOT DETECTED
BARBITURATES: NOT DETECTED
BENZODIAZEPINES: NOT DETECTED
COCAINE: NOT DETECTED
OPIATES: NOT DETECTED
TETRAHYDROCANNABINOL: POSITIVE — AB

## 2014-12-02 MED ORDER — KETOROLAC TROMETHAMINE 30 MG/ML IJ SOLN
30.0000 mg | Freq: Once | INTRAMUSCULAR | Status: DC | PRN
  Administered 2014-12-02: 30 mg via INTRAVENOUS
  Filled 2014-12-02: qty 1

## 2014-12-02 MED ORDER — SODIUM CHLORIDE 0.9 % IV BOLUS (SEPSIS)
500.0000 mL | Freq: Once | INTRAVENOUS | Status: AC
  Administered 2014-12-02: 500 mL via INTRAVENOUS

## 2014-12-02 MED ORDER — PROCHLORPERAZINE EDISYLATE 5 MG/ML IJ SOLN
10.0000 mg | Freq: Once | INTRAMUSCULAR | Status: AC
  Administered 2014-12-02: 10 mg via INTRAVENOUS
  Filled 2014-12-02: qty 2

## 2014-12-02 MED ORDER — HYDROCODONE-ACETAMINOPHEN 5-325 MG PO TABS: ORAL_TABLET | ORAL | Status: DC

## 2014-12-02 NOTE — ED Provider Notes (Signed)
CSN: [id number]     Arrival date & time 12/02/14  1334 History   First MD Initiated Contact with Patient 12/02/14 1344     Chief Complaint  Patient presents with  . Fall     (Consider location/radiation/quality/duration/timing/severity/associated sxs/prior Treatment) HPI   Kelly Richard is a 29 y.o. female who is here for evaluation of injuries from a fall, 6 days ago. She describes falling down 5 steps, tumbling and rolling, and injuring herself "all over". Since that time. She is bothered mostly by persistent headache, left-sided pain and left hip pain. She is able to walk. He is using over-the-counter anti-plantar medications, without relief. She denies blurred vision, nausea, vomiting, anorexia, diarrhea, or dysuria. There are no other known modifying factors.   Past Medical History  Diagnosis Date  . Degenerative disc disease   . Thyroid disease   . Panic attacks    Past Surgical History  Procedure Laterality Date  . Tubal ligation     Family History  Problem Relation Age of Onset  . Cancer Mother   . Cancer Father   . Heart failure Other   . COPD Other   . Stroke Other    Social History  Substance Use Topics  . Smoking status: Current Every Day Smoker -- 1.00 packs/day for 14 years    Types: Cigarettes  . Smokeless tobacco: Never Used  . Alcohol Use: Yes     Comment: occas   OB History    Gravida Para Term Preterm AB TAB SAB Ectopic Multiple Living   8 2 2  6  6   2      Review of Systems  All other systems reviewed and are negative.     Allergies  Amoxicillin and Penicillins  Home Medications   Prior to Admission medications   Medication Sig Start Date End Date Taking? Authorizing Provider  ibuprofen (ADVIL,MOTRIN) 200 MG tablet Take 400 mg by mouth every 6 (six) hours as needed.   Yes Historical Provider, MD  levothyroxine (SYNTHROID, LEVOTHROID) 125 MCG tablet Take 125 mcg by mouth daily before breakfast.   Yes Historical Provider, MD  naproxen  sodium (ANAPROX) 220 MG tablet Take 440 mg by mouth 2 (two) times daily with a meal.   Yes Historical Provider, MD  baclofen (LIORESAL) 10 MG tablet 2 po tid with food Patient not taking: Reported on 04/20/2014 07/08/13   Ivery Quale, PA-C  HYDROcodone-acetaminophen (NORCO/VICODIN) 5-325 MG per tablet Take one-two tabs po q 4-6 hrs prn pain 12/02/14   Mancel Bale, MD  meloxicam (MOBIC) 7.5 MG tablet 1 po bid with food Patient not taking: Reported on 04/20/2014 07/08/13   Ivery Quale, PA-C   BP 115/85 mmHg  Pulse 79  Temp(Src) 98 F (36.7 C) (Oral)  Resp 18  Ht 5' 7.75" (1.721 m)  Wt 170 lb (77.111 kg)  BMI 26.03 kg/m2  SpO2 100%  LMP 11/13/2014 Physical Exam  Constitutional: She is oriented to person, place, and time. She appears well-developed and well-nourished.  HENT:  Head: Normocephalic and atraumatic.  Right Ear: External ear normal.  Left Ear: External ear normal.  No visible or palpable injuries to the scalp.  Eyes: Conjunctivae and EOM are normal. Pupils are equal, round, and reactive to light.  Neck: Normal range of motion and phonation normal. Neck supple.  Cardiovascular: Normal rate, regular rhythm and normal heart sounds.   Pulmonary/Chest: Effort normal and breath sounds normal. She exhibits no bony tenderness.  Abdominal: Soft. There is no  tenderness.  Musculoskeletal: Normal range of motion.  No tenderness to palpation of the cervical, thoracic or lumbar spines. Mild left lumbar tenderness without contusion. Gait is normal. Normal range of motion, arms and legs bilaterally.  Neurological: She is alert and oriented to person, place, and time. No cranial nerve deficit or sensory deficit. She exhibits normal muscle tone. Coordination normal.  No dysarthria and aphasia or nystagmus.  Skin: Skin is warm, dry and intact.  Psychiatric: She has a normal mood and affect. Her behavior is normal. Judgment and thought content normal.  Nursing note and vitals reviewed.   ED  Course  Procedures (including critical care time)  Medications  ketorolac (TORADOL) 30 MG/ML injection 30 mg (30 mg Intravenous Given 12/02/14 1526)  sodium chloride 0.9 % bolus 500 mL (500 mLs Intravenous New Bag/Given 12/02/14 1501)  prochlorperazine (COMPAZINE) injection 10 mg (10 mg Intravenous Given 12/02/14 1501)    Patient Vitals for the past 24 hrs:  BP Temp Temp src Pulse Resp SpO2 Height Weight  12/02/14 1339 115/85 mmHg 98 F (36.7 C) Oral 79 18 100 % 5' 7.75" (1.721 m) 170 lb (77.111 kg)    3:37 PM Reevaluation with update and discussion. After initial assessment and treatment, an updated evaluation reveals she states that she is feeling better. Findings discussed with the patient, all questions were answered. Colon Rueth L    Labs Review Labs Reviewed  COMPREHENSIVE METABOLIC PANEL - Abnormal; Notable for the following:    Glucose, Bld 102 (*)    Calcium 8.5 (*)    All other components within normal limits  CBC WITH DIFFERENTIAL/PLATELET - Abnormal; Notable for the following:    WBC 11.4 (*)    Neutro Abs 8.6 (*)    All other components within normal limits  URINE RAPID DRUG SCREEN, HOSP PERFORMED - Abnormal; Notable for the following:    Tetrahydrocannabinol POSITIVE (*)    All other components within normal limits  URINALYSIS, ROUTINE W REFLEX MICROSCOPIC (NOT AT [facility])    Imaging Review Ct Head Wo Contrast  12/02/2014   CLINICAL DATA:  Larey Seat on 11/27/2014 striking head, pain to front and top of head, initial encounter  EXAM: CT HEAD WITHOUT CONTRAST  TECHNIQUE: Contiguous axial images were obtained from the base of the skull through the vertex without intravenous contrast.  COMPARISON:  09/28/2009  FINDINGS: Normal ventricular morphology.  No midline shift or mass effect.  Normal appearance of brain parenchyma.  No intracranial hemorrhage, mass lesion, or acute infarction.  Visualized paranasal sinuses and mastoid air cells clear.  Bones unremarkable.  IMPRESSION:  Normal exam.   Electronically Signed   By: Ulyses Southward M.D.   On: 12/02/2014 15:18   I have personally reviewed and evaluated these images and lab results as part of my medical decision-making.   EKG Interpretation None      MDM   Final diagnoses:  Head injury, initial encounter  Contusion, multiple sites    Fall, subacute, with persistent headache and multiple contusions without apparent fracture or serious injury.  Nursing Notes Reviewed/ Care Coordinated Applicable Imaging Reviewed Interpretation of Laboratory Data incorporated into ED treatment  The patient appears reasonably screened and/or stabilized for discharge and I doubt any other medical condition or other [facility] requiring further screening, evaluation, or treatment in the ED at this time prior to discharge.  Plan: Home Medications- Norco; Home Treatments- rest, heat; return here if the recommended treatment, does not improve the symptoms; Recommended follow up- PCP prn  Mancel Bale, MD 12/02/14 1538

## 2014-12-02 NOTE — Discharge Instructions (Signed)
Contusion °A contusion is a deep bruise. Contusions are the result of an injury that caused bleeding under the skin. The contusion may turn blue, purple, or yellow. Minor injuries will give you a painless contusion, but more severe contusions may stay painful and swollen for a few weeks.  °CAUSES  °A contusion is usually caused by a blow, trauma, or direct force to an area of the body. °SYMPTOMS  °· Swelling and redness of the injured area. °· Bruising of the injured area. °· Tenderness and soreness of the injured area. °· Pain. °DIAGNOSIS  °The diagnosis can be made by taking a history and physical exam. An X-ray, CT scan, or MRI may be needed to determine if there were any associated injuries, such as fractures. °TREATMENT  °Specific treatment will depend on what area of the body was injured. In general, the best treatment for a contusion is resting, icing, elevating, and applying cold compresses to the injured area. Over-the-counter medicines may also be recommended for pain control. Ask your caregiver what the best treatment is for your contusion. °HOME CARE INSTRUCTIONS  °· Put ice on the injured area. °· Put ice in a plastic bag. °· Place a towel between your skin and the bag. °· Leave the ice on for 15-20 minutes, 3-4 times a day, or as directed by your health care provider. °· Only take over-the-counter or prescription medicines for pain, discomfort, or fever as directed by your caregiver. Your caregiver may recommend avoiding anti-inflammatory medicines (aspirin, ibuprofen, and naproxen) for 48 hours because these medicines may increase bruising. °· Rest the injured area. °· If possible, elevate the injured area to reduce swelling. °SEEK IMMEDIATE MEDICAL CARE IF:  °· You have increased bruising or swelling. °· You have pain that is getting worse. °· Your swelling or pain is not relieved with medicines. °MAKE SURE YOU:  °· Understand these instructions. °· Will watch your condition. °· Will get help right  away if you are not doing well or get worse. °Document Released: 01/05/2005 Document Revised: 04/02/2013 Document Reviewed: 01/31/2011 °ExitCare® Patient Information ©2015 [employer]. This information is not intended to replace advice given to you by your health care provider. Make sure you discuss any questions you have with your health care provider. ° ° °Head Injury °You have received a head injury. It does not appear serious at this time. Headaches and vomiting are common following head injury. It should be easy to awaken from sleeping. Sometimes it is necessary for you to stay in the emergency department for a while for observation. Sometimes admission to the hospital may be needed. After injuries such as yours, most problems occur within the first 24 hours, but side effects may occur up to 7-10 days after the injury. It is important for you to carefully monitor your condition and contact your health care provider or seek immediate medical care if there is a change in your condition. °WHAT ARE THE TYPES OF HEAD INJURIES? °Head injuries can be as minor as a bump. Some head injuries can be more severe. More severe head injuries include: °· A jarring injury to the brain (concussion). °· A bruise of the brain (contusion). This mean there is bleeding in the brain that can cause swelling. °· A cracked skull (skull fracture). °· Bleeding in the brain that collects, clots, and forms a bump (hematoma). °WHAT CAUSES A HEAD INJURY? °A serious head injury is most likely to happen to someone who is in a car wreck and is not   wearing a seat belt. Other causes of major head injuries include bicycle or motorcycle accidents, sports injuries, and falls. °HOW ARE HEAD INJURIES DIAGNOSED? °A complete history of the event leading to the injury and your current symptoms will be helpful in diagnosing head injuries. Many times, pictures of the brain, such as CT or MRI are needed to see the extent of the injury. Often, an overnight  hospital stay is necessary for observation.  °WHEN SHOULD I SEEK IMMEDIATE MEDICAL CARE?  °You should get help right away if: °· You have confusion or drowsiness. °· You feel sick to your stomach (nauseous) or have continued, forceful vomiting. °· You have dizziness or unsteadiness that is getting worse. °· You have severe, continued headaches not relieved by medicine. Only take over-the-counter or prescription medicines for pain, fever, or discomfort as directed by your health care provider. °· You do not have normal function of the arms or legs or are unable to walk. °· You notice changes in the black spots in the center of the colored part of your eye (pupil). °· You have a clear or bloody fluid coming from your nose or ears. °· You have a loss of vision. °During the next 24 hours after the injury, you must stay with someone who can watch you for the warning signs. This person should contact local emergency services (911 in the [place] if you have seizures, you become unconscious, or you are unable to wake up. °HOW CAN I PREVENT A HEAD INJURY IN THE FUTURE? °The most important factor for preventing major head injuries is avoiding motor vehicle accidents.  To minimize the potential for damage to your head, it is crucial to wear seat belts while riding in motor vehicles. Wearing helmets while bike riding and playing collision sports (like football) is also helpful. Also, avoiding dangerous activities around the house will further help reduce your risk of head injury.  °WHEN CAN I RETURN TO NORMAL ACTIVITIES AND ATHLETICS? °You should be reevaluated by your health care provider before returning to these activities. If you have any of the following symptoms, you should not return to activities or contact sports until 1 week after the symptoms have stopped: °· Persistent headache. °· Dizziness or vertigo. °· Poor attention and concentration. °· Confusion. °· Memory problems. °· Nausea or vomiting. °· Fatigue or tire  easily. °· Irritability. °· Intolerant of bright lights or loud noises. °· Anxiety or depression. °· Disturbed sleep. °MAKE SURE YOU:  °· Understand these instructions. °· Will watch your condition. °· Will get help right away if you are not doing well or get worse. °Document Released: 03/28/2005 Document Revised: 04/02/2013 Document Reviewed: 12/03/2012 °ExitCare® Patient Information ©2015 [employer]. This information is not intended to replace advice given to you by your health care provider. Make sure you discuss any questions you have with your health care provider. ° °

## 2014-12-02 NOTE — ED Notes (Signed)
MD at bedside. 

## 2014-12-02 NOTE — ED Notes (Signed)
Pt states she fell down the starts on Thursday. Since pt has had a headache, right hip, and lower back pain. Pt walked to triage with no difficulties in ambulation noted. Pt states her pain is not getting better with tylenol. Pt is unable to state if she hit her head or not.

## 2014-12-02 NOTE — ED Notes (Signed)
Pt verbalized understanding of no driving and to use caution within 4 hours of taking pain meds due to meds cause drowsiness 

## 2014-12-03 ENCOUNTER — Encounter: Payer: Self-pay | Admitting: Adult Health

## 2014-12-03 ENCOUNTER — Other Ambulatory Visit (HOSPITAL_COMMUNITY)
Admission: RE | Admit: 2014-12-03 | Discharge: 2014-12-03 | Disposition: A | Discharge status: Home / Self Care | Payer: Medicaid Other | Source: Ambulatory Visit | Attending: Adult Health | Admitting: Adult Health

## 2014-12-03 ENCOUNTER — Ambulatory Visit (INDEPENDENT_AMBULATORY_CARE_PROVIDER_SITE_OTHER): Payer: Medicaid Other | Admitting: Adult Health

## 2014-12-03 VITALS — BP 108/70 | HR 72 | Ht 67.0 in | Wt 194.0 lb

## 2014-12-03 DIAGNOSIS — Z01419 Encounter for gynecological examination (general) (routine) without abnormal findings: Secondary | ICD-10-CM

## 2014-12-03 DIAGNOSIS — Z1151 Encounter for screening for human papillomavirus (HPV): Secondary | ICD-10-CM | POA: Insufficient documentation

## 2014-12-03 DIAGNOSIS — Z0000 Encounter for general adult medical examination without abnormal findings: Secondary | ICD-10-CM | POA: Diagnosis not present

## 2014-12-03 DIAGNOSIS — Z113 Encounter for screening for infections with a predominantly sexual mode of transmission: Secondary | ICD-10-CM | POA: Diagnosis present

## 2014-12-03 DIAGNOSIS — E018 Other iodine-deficiency related thyroid disorders and allied conditions: Secondary | ICD-10-CM

## 2014-12-03 MED ORDER — LEVOTHYROXINE SODIUM 125 MCG PO TABS: 125.0000 ug | ORAL_TABLET | Freq: Every day | ORAL | Status: DC

## 2014-12-03 NOTE — Patient Instructions (Signed)
Physical in 1 year 

## 2014-12-03 NOTE — Progress Notes (Signed)
Patient ID: Kelly Richard, female   DOB: Jun 27, 1985, 29 y.o.   MRN: [id number] History of Present Illness: Kelly Richard is a 29 year old white female in for well woman gyn exam and pap, last pap 2011.She is trying to get in at [facility] for PCP.She is sp tubal and has no complaints, has been clean for 5 years now, recently had to pull some time for something done in 2011,seen in ER yesterday fell down steps.   Current Medications, Allergies, Past Medical History, Past Surgical History, Family History and Social History were reviewed in [employer] record.     Review of Systems: Patient denies any headaches, hearing loss, fatigue, blurred vision, shortness of breath, chest pain, abdominal pain, problems with bowel movements, urination, or intercourse. No joint pain or mood swings.    Physical Exam:BP 108/70 mmHg  Pulse 72  Ht  (1.702 m)  Wt 194 lb (87.998 kg)  BMI 30.38 kg/m2  LMP 11/13/2014 General:  Well developed, well nourished, no acute distress Skin:  Warm and dry Neck:  Midline trachea, normal thyroid, good ROM, no lymphadenopathy Lungs; Clear to auscultation bilaterally Breast:  No dominant palpable mass, retraction, or nipple discharge Cardiovascular: Regular rate and rhythm Abdomen:  Soft, non tender, no hepatosplenomegaly Pelvic:  External genitalia is normal in appearance, no lesions.  The vagina is normal in appearance. Urethra has no lesions or masses. The cervix is bulbous, pap with HPV and GC/CHL performed.  Uterus is felt to be normal size, shape, and contour.  No adnexal masses or tenderness noted.Bladder is non tender, no masses felt. Extremities/musculoskeletal:  No swelling or varicosities noted, no clubbing or cyanosis Psych:  No mood changes, alert and cooperative,seems happy   Impression: Well woman gyn exam with pap Hypothyroid     Plan: Refilled synthroid 125 mcg #30 take 1 daily with 11 refills Physical in 1  year Check TSH

## 2014-12-04 ENCOUNTER — Telehealth: Payer: Self-pay | Admitting: Adult Health

## 2014-12-04 LAB — TSH: TSH: 29.99 u[IU]/mL — AB (ref 0.450–4.500)

## 2014-12-04 MED ORDER — LEVOTHYROXINE SODIUM 137 MCG PO TABS: 137.0000 ug | ORAL_TABLET | Freq: Every day | ORAL | Status: DC

## 2014-12-04 NOTE — Telephone Encounter (Signed)
Pt aware of TSH and synthroid increased to 137 mcg with 2 refills, get TSH rechecked in 8 weeks, has appt at [facility] in September

## 2014-12-05 LAB — CYTOLOGY - PAP

## 2014-12-08 ENCOUNTER — Telehealth: Payer: Self-pay | Admitting: Adult Health

## 2014-12-08 MED ORDER — METRONIDAZOLE 500 MG PO TABS: 500.0000 mg | ORAL_TABLET | Freq: Two times a day (BID) | ORAL | Status: DC

## 2014-12-08 NOTE — Telephone Encounter (Signed)
Pt aware pap normal with treat BV with flagyl, no alcohol

## 2015-02-27 ENCOUNTER — Ambulatory Visit (HOSPITAL_COMMUNITY)
Admission: RE | Admit: 2015-02-27 | Discharge: 2015-02-27 | Disposition: A | Discharge status: Home / Self Care | Payer: Medicaid Other | Source: Ambulatory Visit | Attending: Family Medicine | Admitting: Family Medicine

## 2015-02-27 ENCOUNTER — Other Ambulatory Visit (HOSPITAL_COMMUNITY): Payer: Self-pay | Admitting: Family Medicine

## 2015-02-27 DIAGNOSIS — M545 Low back pain: Secondary | ICD-10-CM | POA: Insufficient documentation

## 2015-02-27 DIAGNOSIS — M5136 Other intervertebral disc degeneration, lumbar region: Secondary | ICD-10-CM | POA: Insufficient documentation

## 2015-05-01 ENCOUNTER — Other Ambulatory Visit (HOSPITAL_COMMUNITY): Payer: Self-pay | Admitting: Family Medicine

## 2015-05-01 DIAGNOSIS — M5410 Radiculopathy, site unspecified: Secondary | ICD-10-CM

## 2015-05-07 ENCOUNTER — Ambulatory Visit (HOSPITAL_COMMUNITY)
Admission: RE | Admit: 2015-05-07 | Discharge: 2015-05-07 | Disposition: A | Discharge status: Home / Self Care | Payer: Medicaid Other | Source: Ambulatory Visit | Attending: Family Medicine | Admitting: Family Medicine

## 2015-05-07 DIAGNOSIS — M5410 Radiculopathy, site unspecified: Secondary | ICD-10-CM

## 2015-05-07 DIAGNOSIS — M545 Low back pain: Secondary | ICD-10-CM | POA: Insufficient documentation

## 2015-05-07 DIAGNOSIS — M5136 Other intervertebral disc degeneration, lumbar region: Secondary | ICD-10-CM | POA: Insufficient documentation

## 2015-09-28 ENCOUNTER — Telehealth: Payer: Self-pay | Admitting: Adult Health

## 2015-09-28 NOTE — Telephone Encounter (Signed)
Left message x 1. JSY 

## 2015-09-29 NOTE — Telephone Encounter (Signed)
Spoke with pt. Pt has had normal periods x 5 years. Pt started spotting Sunday and it got heavier yesterday. She wasn't supposed to start until week after next. 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 I advised to do a home pregnancy test and if negative, see if bleeding will stop. Period may just be early this month. Pt has an appt in August. I advised if pt feels like she needs to be seen sooner, call office for appt. Pt voiced understanding. JSY

## 2015-12-07 ENCOUNTER — Other Ambulatory Visit: Payer: Medicaid Other | Admitting: Adult Health

## 2016-02-01 ENCOUNTER — Other Ambulatory Visit: Payer: Medicaid Other | Admitting: Adult Health

## 2016-02-25 ENCOUNTER — Other Ambulatory Visit: Payer: Medicaid Other | Admitting: Adult Health

## 2016-03-14 ENCOUNTER — Other Ambulatory Visit: Payer: Medicaid Other | Admitting: Adult Health

## 2016-03-28 ENCOUNTER — Other Ambulatory Visit: Payer: Medicaid Other | Admitting: Adult Health

## 2016-04-12 ENCOUNTER — Other Ambulatory Visit: Payer: Medicaid Other | Admitting: Adult Health

## 2016-04-25 ENCOUNTER — Other Ambulatory Visit: Payer: Medicaid Other | Admitting: Adult Health

## 2017-11-01 ENCOUNTER — Telehealth: Payer: Self-pay | Admitting: Adult Health

## 2017-11-01 NOTE — Telephone Encounter (Signed)
Pt would like for Jennifer to give her a call today °

## 2017-11-01 NOTE — Telephone Encounter (Signed)
Pt called, stressed, having anxiety and panic attacks and not sleeping, dad has cancer and son is depressed too.To come in tomorrow at 2 pm as work in

## 2017-11-02 ENCOUNTER — Ambulatory Visit (INDEPENDENT_AMBULATORY_CARE_PROVIDER_SITE_OTHER): Payer: Self-pay | Admitting: Adult Health

## 2017-11-02 ENCOUNTER — Encounter: Payer: Self-pay | Admitting: Adult Health

## 2017-11-02 ENCOUNTER — Other Ambulatory Visit: Payer: Self-pay

## 2017-11-02 VITALS — BP 103/72 | HR 74 | Ht 67.75 in | Wt 162.0 lb

## 2017-11-02 DIAGNOSIS — F32A Depression, unspecified: Secondary | ICD-10-CM | POA: Insufficient documentation

## 2017-11-02 DIAGNOSIS — F329 Major depressive disorder, single episode, unspecified: Secondary | ICD-10-CM

## 2017-11-02 DIAGNOSIS — E018 Other iodine-deficiency related thyroid disorders and allied conditions: Secondary | ICD-10-CM

## 2017-11-02 MED ORDER — ESCITALOPRAM OXALATE 10 MG PO TABS: 10.0000 mg | ORAL_TABLET | Freq: Every day | ORAL | 3 refills | Status: DC

## 2017-11-02 MED ORDER — LORAZEPAM 0.5 MG PO TABS: 0.5000 mg | ORAL_TABLET | Freq: Three times a day (TID) | ORAL | 0 refills | Status: DC | PRN

## 2017-11-02 MED ORDER — LEVOTHYROXINE SODIUM 50 MCG PO TABS
50.0000 ug | ORAL_TABLET | Freq: Every day | ORAL | 1 refills | Status: DC
Start: 2017-11-02 — End: 2021-01-11

## 2017-11-02 NOTE — Progress Notes (Signed)
  Subjective:     Patient ID: Kelly Richard, female   DOB: 1986-04-10, 32 y.o.   MRN: [id number]  HPI Kelly Richard is a 32 year old white female, married in complaining of being depressed and had panic attack.She has not had thyroid meds in about 3 months and Daddy has cancer and is in hospital in [place].  Review of Systems +depressed Not sleeping well Has had panic attack Reviewed past medical,surgical, social and family history. Reviewed medications and allergies.     Objective:   Physical Exam BP 103/72 (BP Location: Right Arm, Patient Position: Sitting, Cuff Size: Normal)   Pulse 74   Ht 5' 7.75" (1.721 m)   Wt 162 lb (73.5 kg)   LMP 10/22/2017   BMI 24.81 kg/m  Skin warm and dry. Neck: mid line trachea, normal thyroid, good ROM, no lymphadenopathy noted. Lungs: clear to ausculation bilaterally. Cardiovascular: regular rate and rhythm. PHQ 9 score 24, denies being suicidal or homicidal, and is open to taking meds, declines counseling at present.  Will start levothyroxine back at 50 mcg, 1 daily  and Rx lexapro 10 mg 1 daily( she used for postpartum depression and it worked) and ativan prn and F/U in 3  Weeks and check TSH then. She is needing pap and physical and mammogram as Mom had breast cancer at about 35.    Assessment:     1. Depression, unspecified depression type   2. HYPOTHYROIDISM, POST-RADIOACTIVE IODINE       Plan:     Meds ordered this encounter  Medications  . levothyroxine (SYNTHROID, LEVOTHROID) 50 MCG tablet    Sig: Take 1 tablet (50 mcg total) by mouth daily before breakfast.    Dispense:  30 tablet    Refill:  1    Order Specific Question:   Supervising Provider    Answer:   Despina HiddenEURE, LUTHER H [[id number]]  . escitalopram (LEXAPRO) 10 MG tablet    Sig: Take 1 tablet (10 mg total) by mouth daily.    Dispense:  30 tablet    Refill:  3    Order Specific Question:   Supervising Provider    Answer:   Despina HiddenEURE, LUTHER H [[id number]]  . LORazepam (ATIVAN) 0.5 MG tablet     Sig: Take 1 tablet (0.5 mg total) by mouth every 8 (eight) hours as needed for anxiety.    Dispense:  30 tablet    Refill:  0    Order Specific Question:   Supervising Provider    Answer:   Despina HiddenEURE, LUTHER H [[id number]]  F/U in 3 weeks for ROS and check TSH Go apply for medicaid, needs pap and physical and mammogram

## 2017-11-23 ENCOUNTER — Ambulatory Visit: Payer: Self-pay | Admitting: Adult Health

## 2017-12-04 ENCOUNTER — Ambulatory Visit: Payer: Self-pay | Admitting: Adult Health

## 2017-12-14 ENCOUNTER — Ambulatory Visit: Payer: Self-pay | Admitting: Adult Health

## 2019-03-18 ENCOUNTER — Telehealth: Payer: Self-pay | Admitting: *Deleted

## 2019-03-18 NOTE — Telephone Encounter (Signed)
Pt left message that she has had a lump in her breast for a while. It has grown. She has family history of breast cancer. Her mother died at 77 of it. She wanted to see if [facility] can refer her for a mammogram.

## 2019-03-18 NOTE — Telephone Encounter (Signed)
Left message to call the office for appt, I need to assess the lump and then schedule mammogram and [place]

## 2019-03-21 ENCOUNTER — Ambulatory Visit (INDEPENDENT_AMBULATORY_CARE_PROVIDER_SITE_OTHER): Payer: Self-pay | Admitting: Adult Health

## 2019-03-21 ENCOUNTER — Other Ambulatory Visit: Payer: Self-pay

## 2019-03-21 ENCOUNTER — Encounter: Payer: Self-pay | Admitting: Adult Health

## 2019-03-21 VITALS — BP 108/72 | HR 86 | Ht 67.0 in | Wt 158.0 lb

## 2019-03-21 DIAGNOSIS — N6311 Unspecified lump in the right breast, upper outer quadrant: Secondary | ICD-10-CM | POA: Insufficient documentation

## 2019-03-21 DIAGNOSIS — Z803 Family history of malignant neoplasm of breast: Secondary | ICD-10-CM

## 2019-03-21 NOTE — Progress Notes (Signed)
  Subjective:     Patient ID: Kelly Richard, female   DOB: Apr 11, 1986, 33 y.o.   MRN: [id number]  HPI Kelly Richard is a 33 year old white female, married, [id number], in complaining or right breast mass for 4 months, and her mom died from breast cancer at 48.  Review of Systems Has right breast mass for 4 months, seems to be bigger and is tender and has had white milky nipple discharge at times She has not had synthroid in over a year  Reviewed past medical,surgical, social and family history. Reviewed medications and allergies.     Objective:   Physical Exam BP 108/72 (BP Location: Left Arm, Patient Position: Sitting, Cuff Size: Normal)   Pulse 86   Ht 5\' 7"  (1.702 m)   Wt 158 lb (71.7 kg)   LMP 02/15/2019 (Exact Date)   BMI 24.75 kg/m  Skin warm and dry. Neck: mid line trachea, normal thyroid, good ROM, no lymphadenopathy noted. Lungs: clear to ausculation bilaterally. Cardiovascular: regular rate and rhythm.  Skin warm and dry,  Breasts:no dominate palpable mass, retraction or nipple discharge on left, on right no retraction or nipple discharge but has pea sized mass at 9-10 o'clock, and is tender, she has regular irregularities, bilaterally but more on the right. Co exam with Weyman Croon FNP student.      Assessment:     1. Mass of upper outer quadrant of right breast   2. Family history of breast cancer in mother       Plan:     Diagnostic bilateral  mammogram and Right and left [place] scheduled for 12/29 at 11 am at [facility] Follow up in 6 weeks for physical and labs   Leave breast alone

## 2019-04-09 ENCOUNTER — Other Ambulatory Visit (HOSPITAL_COMMUNITY): Payer: Self-pay

## 2019-04-09 ENCOUNTER — Encounter (HOSPITAL_COMMUNITY): Payer: Self-pay

## 2019-04-09 ENCOUNTER — Other Ambulatory Visit: Payer: Self-pay

## 2019-04-09 ENCOUNTER — Ambulatory Visit (HOSPITAL_COMMUNITY)
Admission: RE | Admit: 2019-04-09 | Discharge: 2019-04-09 | Disposition: A | Discharge status: Home / Self Care | Payer: Self-pay | Source: Ambulatory Visit | Attending: Adult Health | Admitting: Adult Health

## 2019-04-09 ENCOUNTER — Ambulatory Visit (HOSPITAL_COMMUNITY): Payer: Self-pay

## 2019-04-09 DIAGNOSIS — N6311 Unspecified lump in the right breast, upper outer quadrant: Secondary | ICD-10-CM | POA: Insufficient documentation

## 2019-05-02 ENCOUNTER — Other Ambulatory Visit: Payer: Self-pay | Admitting: Adult Health

## 2019-05-03 ENCOUNTER — Ambulatory Visit: Payer: PRIVATE HEALTH INSURANCE | Attending: Internal Medicine

## 2019-05-03 ENCOUNTER — Other Ambulatory Visit: Payer: Self-pay

## 2019-05-03 DIAGNOSIS — Z20822 Contact with and (suspected) exposure to covid-19: Secondary | ICD-10-CM

## 2019-05-04 LAB — NOVEL CORONAVIRUS, NAA: SARS-CoV-2, NAA: NOT DETECTED

## 2019-05-20 ENCOUNTER — Other Ambulatory Visit: Payer: Self-pay | Admitting: Obstetrics & Gynecology

## 2019-06-03 ENCOUNTER — Other Ambulatory Visit: Payer: Self-pay

## 2019-06-03 ENCOUNTER — Ambulatory Visit: Payer: PRIVATE HEALTH INSURANCE | Attending: Internal Medicine

## 2019-06-03 DIAGNOSIS — Z20822 Contact with and (suspected) exposure to covid-19: Secondary | ICD-10-CM

## 2019-06-04 LAB — NOVEL CORONAVIRUS, NAA: SARS-CoV-2, NAA: NOT DETECTED

## 2019-06-05 DIAGNOSIS — M47816 Spondylosis without myelopathy or radiculopathy, lumbar region: Secondary | ICD-10-CM | POA: Insufficient documentation

## 2019-06-10 ENCOUNTER — Other Ambulatory Visit: Payer: Self-pay | Admitting: Obstetrics & Gynecology

## 2019-06-11 ENCOUNTER — Other Ambulatory Visit: Payer: Self-pay

## 2019-06-11 ENCOUNTER — Other Ambulatory Visit (HOSPITAL_COMMUNITY): Payer: Self-pay | Admitting: Anesthesiology

## 2019-06-11 ENCOUNTER — Ambulatory Visit (HOSPITAL_COMMUNITY)
Admission: RE | Admit: 2019-06-11 | Discharge: 2019-06-11 | Disposition: A | Discharge status: Home / Self Care | Payer: PRIVATE HEALTH INSURANCE | Source: Ambulatory Visit | Attending: Anesthesiology | Admitting: Anesthesiology

## 2019-06-11 DIAGNOSIS — M5440 Lumbago with sciatica, unspecified side: Secondary | ICD-10-CM

## 2019-06-19 ENCOUNTER — Ambulatory Visit (HOSPITAL_COMMUNITY): Payer: PRIVATE HEALTH INSURANCE | Attending: Anesthesiology | Admitting: Physical Therapy

## 2019-06-19 ENCOUNTER — Encounter (HOSPITAL_COMMUNITY): Payer: Self-pay

## 2019-06-26 ENCOUNTER — Ambulatory Visit (HOSPITAL_COMMUNITY): Payer: PRIVATE HEALTH INSURANCE | Admitting: Physical Therapy

## 2019-07-09 DIAGNOSIS — F332 Major depressive disorder, recurrent severe without psychotic features: Secondary | ICD-10-CM | POA: Insufficient documentation

## 2019-07-10 ENCOUNTER — Encounter (HOSPITAL_COMMUNITY): Payer: Self-pay | Admitting: Physical Therapy

## 2019-07-10 ENCOUNTER — Ambulatory Visit (HOSPITAL_COMMUNITY): Payer: PRIVATE HEALTH INSURANCE | Admitting: Physical Therapy

## 2019-07-10 ENCOUNTER — Other Ambulatory Visit: Payer: Self-pay

## 2019-07-10 NOTE — Therapy (Deleted)
[facility] [address] [place], [place], [address] Phone: [phone]   Fax:  [phone]  Patient Details  Name: Kelly Richard MRN: [id number] Date of Birth: [birth year] Referring Provider:  Racheal Patches, MD  Encounter Date: 07/10/2019   Patient arrived for visit. When PT went to get patient, she was speaking with front desk that she needed to leave for family emergency and was rescheduled for next week.   11:38 AM, 07/10/19 Wyman Songster PT, DPT Physical Therapist at [facility]   [place] [facility] [address] [place], [place], [address] Phone: [phone]   Fax:  [phone]

## 2019-07-17 ENCOUNTER — Encounter (HOSPITAL_COMMUNITY): Payer: Self-pay

## 2019-07-17 ENCOUNTER — Ambulatory Visit (HOSPITAL_COMMUNITY): Payer: PRIVATE HEALTH INSURANCE | Attending: Anesthesiology | Admitting: Physical Therapy

## 2021-01-11 ENCOUNTER — Other Ambulatory Visit: Payer: Self-pay

## 2021-01-11 ENCOUNTER — Ambulatory Visit (INDEPENDENT_AMBULATORY_CARE_PROVIDER_SITE_OTHER): Payer: 59 | Admitting: Registered Nurse

## 2021-01-11 ENCOUNTER — Encounter: Payer: Self-pay | Admitting: Registered Nurse

## 2021-01-11 VITALS — BP 108/79 | HR 81 | Temp 98.2°F | Resp 18 | Ht 67.0 in | Wt 208.1 lb

## 2021-01-11 DIAGNOSIS — E018 Other iodine-deficiency related thyroid disorders and allied conditions: Secondary | ICD-10-CM

## 2021-01-11 DIAGNOSIS — Z130 Encounter for screening for diseases of the blood and blood-forming organs and certain disorders involving the immune mechanism: Secondary | ICD-10-CM

## 2021-01-11 DIAGNOSIS — F32A Depression, unspecified: Secondary | ICD-10-CM | POA: Diagnosis not present

## 2021-01-11 DIAGNOSIS — Z23 Encounter for immunization: Secondary | ICD-10-CM | POA: Diagnosis not present

## 2021-01-11 DIAGNOSIS — Z1329 Encounter for screening for other suspected endocrine disorder: Secondary | ICD-10-CM

## 2021-01-11 DIAGNOSIS — Z803 Family history of malignant neoplasm of breast: Secondary | ICD-10-CM | POA: Diagnosis not present

## 2021-01-11 DIAGNOSIS — Z13228 Encounter for screening for other metabolic disorders: Secondary | ICD-10-CM

## 2021-01-11 DIAGNOSIS — F419 Anxiety disorder, unspecified: Secondary | ICD-10-CM

## 2021-01-11 DIAGNOSIS — Z13220 Encounter for screening for lipoid disorders: Secondary | ICD-10-CM

## 2021-01-11 MED ORDER — LEVOTHYROXINE SODIUM 50 MCG PO TABS: 50.0000 ug | ORAL_TABLET | Freq: Every day | ORAL | 1 refills | Status: DC

## 2021-01-11 MED ORDER — ESCITALOPRAM OXALATE 10 MG PO TABS: 10.0000 mg | ORAL_TABLET | Freq: Every day | ORAL | 0 refills | Status: DC

## 2021-01-11 MED ORDER — HYDROXYZINE HCL 25 MG PO TABS: 25.0000 mg | ORAL_TABLET | Freq: Three times a day (TID) | ORAL | 0 refills | Status: DC | PRN

## 2021-01-11 MED ORDER — LORAZEPAM 0.5 MG PO TABS: 0.5000 mg | ORAL_TABLET | Freq: Two times a day (BID) | ORAL | 2 refills | Status: DC

## 2021-01-11 MED ORDER — LORAZEPAM 0.5 MG PO TABS: 0.5000 mg | ORAL_TABLET | Freq: Three times a day (TID) | ORAL | 2 refills | Status: DC | PRN

## 2021-01-11 NOTE — Progress Notes (Signed)
New Patient Office Visit  Subjective:  Patient ID: Kelly Richard, female    DOB: 07-May-1985  Age: 35 y.o. MRN: [id number]  CC:  Chief Complaint  Patient presents with   New Patient (Initial Visit)    Patient states she is here to establish care and medication refills .    HPI Kelly Richard presents for visit to est care  Histories reviewed and updated with patient.   Anxiety Has been on lexapro and lorazepam in past Worked well Hydroxyzine for breakthrough No complaints or AE Acknowledges concern of benzodiazepines. Would like to restart  Hypothyroidism post radical thyroidectomy. Had been on synthroid po qd. Good effect, no AE Hopes to resume Has gained weight since being off of this med.  Otherwise doing well  Interested in flu shot today.  Past Medical History:  Diagnosis Date   Anxiety    Degenerative disc disease    Depression    History of substance abuse (HCC)    clean since 2015   Panic attacks    Postpartum depression    Thyroid disease    Vaginal Pap smear, abnormal     Past Surgical History:  Procedure Laterality Date   TUBAL LIGATION      Family History  Problem Relation Age of Onset   Cancer Mother        breast   Cancer Father        colon; in remission   Non-Hodgkin's lymphoma Father    Other Maternal Grandmother        hemmorhaged after surgery   Stroke Maternal Grandfather    Emphysema Paternal Grandfather    COPD Paternal Grandfather    Cancer Other    Heart failure Other     Social History   Socioeconomic History   Marital status: Married    Spouse name: Not on file   Number of children: Not on file   Years of education: Not on file   Highest education level: Not on file  Occupational History   Not on file  Tobacco Use   Smoking status: Every Day    Packs/day: 1.50    Years: 16.00    Pack years: 24.00    Types: Cigarettes    Start date: 2006   Smokeless tobacco: Never  Vaping Use   Vaping Use: Never  used  Substance and Sexual Activity   Alcohol use: Yes    Comment: occas   Drug use: Yes    Types: Marijuana   Sexual activity: Yes    Birth control/protection: Surgical    Comment: tubal  Other Topics Concern   Not on file  Social History Narrative   Not on file   Social Determinants of Health   Financial Resource Strain: Not on file  Food Insecurity: Not on file  Transportation Needs: Not on file  Physical Activity: Not on file  Stress: Not on file  Social Connections: Not on file  Intimate Partner Violence: Not on file    ROS Review of Systems  Constitutional: Negative.   HENT: Negative.    Eyes: Negative.   Respiratory: Negative.    Cardiovascular: Negative.   Gastrointestinal: Negative.   Genitourinary: Negative.   Musculoskeletal: Negative.   Skin: Negative.   Neurological: Negative.   Psychiatric/Behavioral: Negative.    All other systems reviewed and are negative.  Objective:   Today's Vitals: BP 108/79   Pulse 81   Temp 98.2 F (36.8 C) (Temporal)   Resp 18  Ht 5\' 7"  (1.702 m)   Wt 208 lb 1.6 oz (94.4 kg)   LMP 01/07/2021   SpO2 99%   BMI 32.59 kg/m   Physical Exam Vitals and nursing note reviewed.  Constitutional:      General: She is not in acute distress.    Appearance: Normal appearance. She is not ill-appearing, toxic-appearing or diaphoretic.  Cardiovascular:     Rate and Rhythm: Normal rate and regular rhythm.     Pulses: Normal pulses.     Heart sounds: Normal heart sounds. No murmur heard.   No friction rub. No gallop.  Pulmonary:     Effort: Pulmonary effort is normal. No respiratory distress.     Breath sounds: Normal breath sounds. No stridor. No wheezing, rhonchi or rales.  Chest:     Chest wall: No tenderness.  Skin:    General: Skin is warm and dry.     Capillary Refill: Capillary refill takes less than 2 seconds.  Neurological:     General: No focal deficit present.     Mental Status: She is alert and oriented to  person, place, and time. Mental status is at baseline.  Psychiatric:        Mood and Affect: Mood normal.        Behavior: Behavior normal.        Thought Content: Thought content normal.        Judgment: Judgment normal.    Assessment & Plan:   Problem List Items Addressed This Visit       Endocrine   HYPOTHYROIDISM, POST-RADIOACTIVE IODINE - Primary   Relevant Medications   levothyroxine (SYNTHROID) 50 MCG tablet   Other Relevant Orders   TSH     Other   Depression   Relevant Medications   escitalopram (LEXAPRO) 10 MG tablet   hydrOXYzine (ATARAX/VISTARIL) 25 MG tablet   LORazepam (ATIVAN) 0.5 MG tablet   Other Visit Diagnoses     Flu vaccine need       Relevant Orders   Flu Vaccine QUAD 6+ mos PF IM (Fluarix Quad PF) (Completed)   Family history of breast cancer       Relevant Orders   Ambulatory referral to Genetics   Anxiety       Relevant Medications   escitalopram (LEXAPRO) 10 MG tablet   hydrOXYzine (ATARAX/VISTARIL) 25 MG tablet   LORazepam (ATIVAN) 0.5 MG tablet   Screening for endocrine, metabolic and immunity disorder       Relevant Orders   Comprehensive metabolic panel   CBC with Differential/Platelet   Hemoglobin A1c   Lipid screening       Relevant Orders   Lipid panel       Outpatient Encounter Medications as of 01/11/2021  Medication Sig   hydrOXYzine (ATARAX/VISTARIL) 25 MG tablet Take 1 tablet (25 mg total) by mouth 3 (three) times daily as needed.   [DISCONTINUED] escitalopram (LEXAPRO) 10 MG tablet Take 1 tablet (10 mg total) by mouth daily.   [DISCONTINUED] levothyroxine (SYNTHROID, LEVOTHROID) 50 MCG tablet Take 1 tablet (50 mcg total) by mouth daily before breakfast.   [DISCONTINUED] LORazepam (ATIVAN) 0.5 MG tablet Take 1 tablet (0.5 mg total) by mouth every 8 (eight) hours as needed for anxiety.   escitalopram (LEXAPRO) 10 MG tablet Take 1 tablet (10 mg total) by mouth daily.   levothyroxine (SYNTHROID) 50 MCG tablet Take 1 tablet  (50 mcg total) by mouth daily before breakfast.   LORazepam (ATIVAN) 0.5 MG tablet Take 1  tablet (0.5 mg total) by mouth 2 (two) times daily.   [DISCONTINUED] LORazepam (ATIVAN) 0.5 MG tablet Take 1 tablet (0.5 mg total) by mouth every 8 (eight) hours as needed for anxiety.   No facility-administered encounter medications on file as of 01/11/2021.    Follow-up: Return in about 6 weeks (around 02/22/2021) for med check - lorazepam and lexapro.   PLAN Resume plan for anxiety and depression as above. Return in 6-8 weeks for med check Restart synthroid daily. Return for labs this week - lab only visit Patient encouraged to call clinic with any questions, comments, or concerns.  Janeece Agee, NP

## 2021-01-11 NOTE — Patient Instructions (Addendum)
[name] to meet you  Come on back for labs this week. First thing in AM - results should be back that afternoon.  Med refills sent, lets check in on those in 6-8 weeks.  Referral to genetic counseling has been sent. They will call you   Thank you  [name]     If you have lab work done today you will be contacted with your lab results within the next 2 weeks.  If you have not heard from [place] then please contact us. The fastest way to get your results is to register for My Chart.   IF you received an x-ray today, you will receive an invoice from [facility]. Please contact [facility] at [phone] with questions or concerns regarding your invoice.   IF you received labwork today, you will receive an invoice from [facility]. Please contact [facility] at [phone] with questions or concerns regarding your invoice.   Our billing staff will not be able to assist you with questions regarding bills from these companies.  You will be contacted with the lab results as soon as they are available. The fastest way to get your results is to activate your My Chart account. Instructions are located on the last page of this paperwork. If you have not heard from [place] regarding the results in 2 weeks, please contact this office.

## 2021-01-12 ENCOUNTER — Other Ambulatory Visit: Payer: Self-pay | Admitting: Registered Nurse

## 2021-01-12 ENCOUNTER — Telehealth: Payer: Self-pay | Admitting: Genetic Counselor

## 2021-01-12 DIAGNOSIS — F419 Anxiety disorder, unspecified: Secondary | ICD-10-CM

## 2021-01-12 MED ORDER — ALPRAZOLAM 0.5 MG PO TABS: 0.5000 mg | ORAL_TABLET | Freq: Two times a day (BID) | ORAL | 0 refills | Status: DC | PRN

## 2021-01-12 NOTE — Progress Notes (Signed)
Had sent in lorazepam in error yesterday. Had discussed alprazolam with patient. Pharmacy contacted.  Jari Sportsman, NP

## 2021-01-12 NOTE — Telephone Encounter (Signed)
Scheduled appt per 10/3 referral. Pt is aware of appt date and time. Pt is aware to arrive 15 mins early to appt.

## 2021-01-14 ENCOUNTER — Other Ambulatory Visit: Payer: 59

## 2021-01-18 ENCOUNTER — Other Ambulatory Visit: Payer: 59

## 2021-01-19 ENCOUNTER — Encounter: Payer: Self-pay | Admitting: Genetic Counselor

## 2021-01-20 ENCOUNTER — Encounter: Payer: Self-pay | Admitting: Genetic Counselor

## 2021-01-20 ENCOUNTER — Inpatient Hospital Stay: Payer: 59 | Attending: Genetic Counselor | Admitting: Genetic Counselor

## 2021-01-20 ENCOUNTER — Other Ambulatory Visit: Payer: Self-pay | Admitting: Genetic Counselor

## 2021-01-20 ENCOUNTER — Inpatient Hospital Stay: Payer: 59

## 2021-01-20 ENCOUNTER — Other Ambulatory Visit: Payer: Self-pay

## 2021-01-20 DIAGNOSIS — Z803 Family history of malignant neoplasm of breast: Secondary | ICD-10-CM | POA: Diagnosis not present

## 2021-01-20 DIAGNOSIS — Z8042 Family history of malignant neoplasm of prostate: Secondary | ICD-10-CM | POA: Diagnosis not present

## 2021-01-20 DIAGNOSIS — N6311 Unspecified lump in the right breast, upper outer quadrant: Secondary | ICD-10-CM

## 2021-01-20 DIAGNOSIS — Z8041 Family history of malignant neoplasm of ovary: Secondary | ICD-10-CM

## 2021-01-20 DIAGNOSIS — Z800 Family history of malignant neoplasm of digestive organs: Secondary | ICD-10-CM

## 2021-01-20 LAB — GENETIC SCREENING ORDER

## 2021-01-20 NOTE — Progress Notes (Signed)
REFERRING PROVIDER: Maximiano Coss, NP [address] [place],  [place] [address]  PRIMARY PROVIDER:  Maximiano Coss, NP  PRIMARY REASON FOR VISIT:  1. Family history of prostate cancer   2. Family history of ovarian cancer   3. Family history of colon cancer   4. Family history of breast cancer in mother      HISTORY OF PRESENT ILLNESS:   Kelly Richard, a 35 y.o. female, was seen for a [facility] cancer genetics consultation at the request of Dr. Orland Mustard due to a family history of breast and colon cancer.  Kelly Richard presents to clinic today to discuss the possibility of a hereditary predisposition to cancer, genetic testing, and to further clarify her future cancer risks, as well as potential cancer risks for family members.   Kelly Richard is a 35 y.o. female with no personal history of cancer.    CANCER HISTORY:  Oncology History   No history exists.     RISK FACTORS:  Menarche was at age 35.  First live birth at age 71.  OCP use for approximately 0 years.  Ovaries intact: yes.  Hysterectomy: no.  Menopausal status: premenopausal.  HRT use: 0 years. Colonoscopy: no; not examined. Mammogram within the last year: yes. Number of breast biopsies: 0. Up to date with pelvic exams: yes. Any excessive radiation exposure in the past: no  Past Medical History:  Diagnosis Date   Anxiety    Degenerative disc disease    Depression    Family history of colon cancer    Family history of ovarian cancer    Family history of prostate cancer    History of substance abuse ([place])    clean since 2015   Panic attacks    Postpartum depression    Thyroid disease    Vaginal Pap smear, abnormal     Past Surgical History:  Procedure Laterality Date   TUBAL LIGATION      Social History   Socioeconomic History   Marital status: Married    Spouse name: Not on file   Number of children: Not on file   Years of education: Not on file   Highest education level: Not on file  Occupational  History   Not on file  Tobacco Use   Smoking status: Every Day    Packs/day: 1.50    Years: 16.00    Pack years: 24.00    Types: Cigarettes    Start date: 2006   Smokeless tobacco: Never  Vaping Use   Vaping Use: Never used  Substance and Sexual Activity   Alcohol use: Yes    Comment: occas   Drug use: Yes    Types: Marijuana   Sexual activity: Yes    Birth control/protection: Surgical    Comment: tubal  Other Topics Concern   Not on file  Social History Narrative   Not on file   Social Determinants of Health   Financial Resource Strain: Not on file  Food Insecurity: Not on file  Transportation Needs: Not on file  Physical Activity: Not on file  Stress: Not on file  Social Connections: Not on file     FAMILY HISTORY:  We obtained a detailed, 4-generation family history.  Significant diagnoses are listed below: Family History  Problem Relation Age of Onset   Breast cancer Mother 19       d. 55   Cervical cancer Mother 67       again at 65  Non-Hodgkin's lymphoma Father    Colon cancer Father        now in remission   Colon polyps Maternal Uncle    Cirrhosis Maternal Uncle    Other Maternal Grandmother        hemmorhaged after surgery   Stroke Maternal Grandfather    Emphysema Paternal Grandfather    COPD Paternal Grandfather    Ovarian cancer Other        MGF's mother   Prostate cancer Other        MGF's father     The patient has a son and daughter who are cancer free. She is an only child.  Her mother is deceased and her father is living.  Her mother was diagnosed with breast cancer at 9 and died at 41.  She has one brother who is cancer free.  Her parents are deceased from non-cancer related issues.  Her grandfather's mother had ovarian cancer and his father had prostate cancer.  The patient's father had colon cancer at 60 and now has B cell lymphoma.  He has two sisters who are cancer free.  His mother is living and his father died of COPD.  [name] is unaware of previous family history of genetic testing for hereditary cancer risks. Patient's maternal ancestors are of [place] descent, and paternal ancestors are of [place] and [place] descent. There is no reported Ashkenazi Jewish ancestry. There is no known consanguinity.  GENETIC COUNSELING ASSESSMENT: Kelly Richard is a 35 y.o. female with a family history of cancer which is somewhat suggestive of a hereditary cancer syndrome and predisposition to cancer given the young ages of onset. We, therefore, discussed and recommended the following at today's visit.   DISCUSSION: We discussed that, in general, most cancer is not inherited in families, but instead is sporadic or familial. Sporadic cancers occur by chance and typically happen at older ages (>50 years) as this type of cancer is caused by genetic changes acquired during an individual's lifetime. Some families have more cancers than would be expected by chance; however, the ages or types of cancer are not consistent with a known genetic mutation or known genetic mutations have been ruled out. This type of familial cancer is thought to be due to a combination of multiple genetic, environmental, hormonal, and lifestyle factors. While this combination of factors likely increases the risk of cancer, the exact source of this risk is not currently identifiable or testable.  We discussed that 5 - 10% of breast cancer is hereditary, with most cases associated with BRCA mutations.  There are other genes that can be associated with hereditary breast cancer syndromes.  These include ATM, CHEK2 and PALB2.  We discussed that testing is beneficial for several reasons including knowing how to follow individuals after completing their treatment, identifying whether potential treatment options such as PARP inhibitors would be beneficial, and understand if other family members could be at risk for cancer and allow them to undergo genetic testing.   We  reviewed the characteristics, features and inheritance patterns of hereditary cancer syndromes. We also discussed genetic testing, including the appropriate family members to test, the process of testing, insurance coverage and turn-around-time for results. We discussed the implications of a negative, positive, carrier and/or variant of uncertain significant result. We recommended Kelly Richard pursue genetic testing for the CancerNext-Expanded+RNAinsight gene panel.   The CancerNext-Expanded gene panel offered by [facility] and includes sequencing and rearrangement analysis for the following 77 genes: AIP, ALK,  APC*, ATM*, AXIN2, BAP1, BARD1, BLM, BMPR1A, BRCA1*, BRCA2*, BRIP1*, CDC73, CDH1*, CDK4, CDKN1B, CDKN2A, CHEK2*, CTNNA1, DICER1, FANCC, FH, FLCN, GALNT12, KIF1B, LZTR1, MAX, MEN1, MET, MLH1*, MSH2*, MSH3, MSH6*, MUTYH*, NBN, NF1*, NF2, NTHL1, PALB2*, PHOX2B, PMS2*, POT1, PRKAR1A, PTCH1, PTEN*, RAD51C*, RAD51D*, RB1, RECQL, RET, SDHA, SDHAF2, SDHB, SDHC, SDHD, SMAD4, SMARCA4, SMARCB1, SMARCE1, STK11, SUFU, TMEM127, TP53*, TSC1, TSC2, VHL and XRCC2 (sequencing and deletion/duplication); EGFR, EGLN1, HOXB13, KIT, MITF, PDGFRA, POLD1, and POLE (sequencing only); EPCAM and GREM1 (deletion/duplication only). DNA and RNA analyses performed for * genes.   Based on Kelly Richard's family history of cancer, she meets medical criteria for genetic testing. Despite that she meets criteria, she may still have an out of pocket cost. We discussed that if her out of pocket cost for testing is over $100, the laboratory will call and confirm whether she wants to proceed with testing.  If the out of pocket cost of testing is less than $100 she will be billed by the genetic testing laboratory.   Based on the patient's family history, a statistical model (Tyrer Cusik) was used to estimate her risk of developing breast cancer. This estimates her lifetime risk of developing breast cancer to be approximately 20.9%. This estimation  does not consider any genetic testing results.  The patient's lifetime breast cancer risk is a preliminary estimate based on available information using one of several models endorsed by the [place] (ACS). The ACS recommends consideration of breast MRI screening as an adjunct to mammography for patients at high risk (defined as 20% or greater lifetime risk). Please note that a woman's breast cancer risk changes over time. It may increase or decrease based on age and any changes to the personal and/or family medical history. The risks and recommendations listed above apply to this patient at this point in time. In the future, she may or may not be eligible for the same medical management strategies and, in some cases, other medical management strategies may become available to her. If she is interested in an updated breast cancer risk assessment at a later date, she can contact us.  Kelly Richard has been determined to be at high risk for breast cancer.  Therefore, we recommend that annual screening with mammography and breast MRI begin at age 20, or 10 years prior to the age of breast cancer diagnosis in a relative (whichever is earlier).  We discussed that Kelly Richard should discuss her individual situation with her referring physician and determine a breast cancer screening plan with which they are both comfortable.      PLAN: After considering the risks, benefits, and limitations, Kelly Richard provided informed consent to pursue genetic testing and the blood sample was sent to [facility] for analysis of the CancerNext-Expanded+RNAinsight. Results should be available within approximately 2-3 weeks' time, at which point they will be disclosed by telephone to Kelly Richard, as will any additional recommendations warranted by these results. Kelly Richard will receive a summary of her genetic counseling visit and a copy of her results once available. This information will also be available in Epic.    Lastly, we encouraged Kelly Richard to remain in contact with cancer genetics annually so that we can continuously update the family history and inform her of any changes in cancer genetics and testing that may be of benefit for this family.   Kelly Richard questions were answered to her satisfaction today. Our contact information was provided should additional questions or concerns arise. Thank you  for the referral and allowing [place] to share in the care of your patient.   Mohini Heathcock P. Florene [place], [place], [facility] Licensed, Insurance risk surveyor [email or web address] phone: [phone]  The patient was seen for a total of 30 minutes in face-to-face genetic counseling.  The patient was seen alone.  This patient was discussed with Drs. Magrinat, Lindi Adie and/or Burr Medico who agrees with the above.    _______________________________________________________________________ For Office Staff:  Number of people involved in session: 1 Was an Intern/ student involved with case: no

## 2021-02-04 ENCOUNTER — Telehealth: Payer: Self-pay | Admitting: Genetic Counselor

## 2021-02-04 ENCOUNTER — Encounter: Payer: Self-pay | Admitting: Genetic Counselor

## 2021-02-04 DIAGNOSIS — Z1379 Encounter for other screening for genetic and chromosomal anomalies: Secondary | ICD-10-CM | POA: Insufficient documentation

## 2021-02-04 NOTE — Telephone Encounter (Signed)
LM on VM that results are back and to please call. 

## 2021-02-08 ENCOUNTER — Telehealth: Payer: Self-pay | Admitting: Genetic Counselor

## 2021-02-08 NOTE — Telephone Encounter (Signed)
Revealed negative genetic testing and variant of uncertain significance in [facility].  Discussed that we do not know why there is cancer in the family. It could be sporadic/familial, due to a change in a gene that she did not inherit, due to a different gene that we are not testing, or maybe our current technology may not be able to pick something up.  It will be important for her to keep in contact with genetics to keep up with whether additional testing may be needed.  Recommended considering breast MRIs in additional to annual mammograms based on Tyrer Cuzick score.  Declined referral to high risk breast clinic.

## 2021-02-09 ENCOUNTER — Ambulatory Visit: Payer: Self-pay | Admitting: Genetic Counselor

## 2021-02-09 DIAGNOSIS — Z1379 Encounter for other screening for genetic and chromosomal anomalies: Secondary | ICD-10-CM

## 2021-02-09 NOTE — Progress Notes (Signed)
HPI:  Kelly Richard was previously seen in the [facility] due to a family history of cancer and concerns regarding a hereditary predisposition to cancer. Please refer to our prior cancer genetics clinic note for more information regarding our discussion, assessment and recommendations, at the time. Kelly Richard recent genetic test results were disclosed to her, as were recommendations warranted by these results. These results and recommendations are discussed in more detail below.  CANCER HISTORY:  Oncology History   No history exists.    FAMILY HISTORY:  We obtained a detailed, 4-generation family history.  Significant diagnoses are listed below: Family History  Problem Relation Age of Onset   Breast cancer Mother 40       d. 45   Cervical cancer Mother 36       again at 77   Non-Hodgkin's lymphoma Father    Colon cancer Father        now in remission   Colon polyps Maternal Uncle    Cirrhosis Maternal Uncle    Other Maternal Grandmother        hemmorhaged after surgery   Stroke Maternal Grandfather    Emphysema Paternal Grandfather    COPD Paternal Grandfather    Ovarian cancer Other        MGF's mother   Prostate cancer Other        MGF's father      The patient has a son and daughter who are cancer free. She is an only child.  Her mother is deceased and her father is living.   Her mother was diagnosed with breast cancer at 26 and died at 66.  She has one brother who is cancer free.  Her parents are deceased from non-cancer related issues.  Her grandfather's mother had ovarian cancer and his father had prostate cancer.   The patient's father had colon cancer at 61 and now has B cell lymphoma.  He has two sisters who are cancer free.  His mother is living and his father died of COPD.   Kelly Richard is unaware of previous family history of genetic testing for hereditary cancer risks. Patient's maternal ancestors are of [place] descent, and paternal ancestors are of  [place] and [place] descent. There is no reported Ashkenazi Jewish ancestry. There is no known consanguinity.  GENETIC TEST RESULTS: Genetic testing reported out on February 03, 2021 through the CancerNext-Expanded+RNAinsight cancer panel found no pathogenic mutations. The CancerNext-Expanded gene panel offered by [facility] and includes sequencing and rearrangement analysis for the following 77 genes: AIP, ALK, APC*, ATM*, AXIN2, BAP1, BARD1, BLM, BMPR1A, BRCA1*, BRCA2*, BRIP1*, CDC73, CDH1*, CDK4, CDKN1B, CDKN2A, CHEK2*, CTNNA1, DICER1, FANCC, FH, FLCN, GALNT12, KIF1B, LZTR1, MAX, MEN1, MET, MLH1*, MSH2*, MSH3, MSH6*, MUTYH*, NBN, NF1*, NF2, NTHL1, PALB2*, PHOX2B, PMS2*, POT1, PRKAR1A, PTCH1, PTEN*, RAD51C*, RAD51D*, RB1, RECQL, RET, SDHA, SDHAF2, SDHB, SDHC, SDHD, SMAD4, SMARCA4, SMARCB1, SMARCE1, STK11, SUFU, TMEM127, TP53*, TSC1, TSC2, VHL and XRCC2 (sequencing and deletion/duplication); EGFR, EGLN1, HOXB13, KIT, MITF, PDGFRA, POLD1, and POLE (sequencing only); EPCAM and GREM1 (deletion/duplication only). DNA and RNA analyses performed for * genes. The test report has been scanned into EPIC and is located under the Molecular Pathology section of the Results Review tab.  A portion of the result report is included below for reference.     We discussed with Kelly Richard that because current genetic testing is not perfect, it is possible there may be a gene mutation in one of these genes that current  testing cannot detect, but that chance is small.  We also discussed, that there could be another gene that has not yet been discovered, or that we have not yet tested, that is responsible for the cancer diagnoses in the family. It is also possible there is a hereditary cause for the cancer in the family that Kelly Richard did not inherit and therefore was not identified in her testing.  Therefore, it is important to remain in touch with cancer genetics in the future so that we can continue to offer Kelly Richard  the most up to date genetic testing.   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  At this time, it is unknown if this variant is associated with increased cancer risk or if this is a normal finding, but most variants such as this get reclassified to being inconsequential. It should not be used to make medical management decisions. With time, we suspect the lab will determine the significance of this variant, if any. If we do learn more about it, we will try to contact Kelly Richard to discuss it further. However, it is important to stay in touch with [place] periodically and keep the address and phone number up to date.  ADDITIONAL GENETIC TESTING: We discussed with Kelly Richard that her genetic testing was fairly extensive.  If there are genes identified to increase cancer risk that can be analyzed in the future, we would be happy to discuss and coordinate this testing at that time.    CANCER SCREENING RECOMMENDATIONS: Kelly Richard test result is considered negative (normal).  This means that we have not identified a hereditary cause for her family history of cancer at this time. Most cancers happen by chance and this negative test suggests that her cancer may fall into this category.    While reassuring, this does not definitively rule out a hereditary predisposition to cancer. It is still possible that there could be genetic mutations that are undetectable by current technology. There could be genetic mutations in genes that have not been tested or identified to increase cancer risk.  Therefore, it is recommended she continue to follow the cancer management and screening guidelines provided by her primary healthcare provider.   An individual's cancer risk and medical management are not determined by genetic test results alone. Overall cancer risk assessment incorporates additional factors, including personal medical history, family history, and  any available genetic information that may result in a personalized plan for cancer prevention and surveillance.  Based on Kelly Richard's family history of cancer, as well as her genetic test results, statistical models (Tyrer Cusik)  and literature data were used to estimate her risk of developing breast cancer. The patient's lifetime breast cancer risk is a preliminary estimate based on available information using one of several models endorsed by the [employer]  (NCCN).  The NCCN recommends consideration of breast MRI screening as an adjunct to mammography for patients at high risk (defined as 20% or greater lifetime risk). Please note that a woman's breast cancer risk changes over time. It may increase or decrease based on age and any changes to the personal and/or family medical history. In the future, she may or may not be eligible for the same medical management strategies and, in some cases, other medical management strategies may become available to her. If she is interested in an updated breast cancer risk assessment at a later date, she can contact  us.   Kelly Richard has been determined to be at high risk for breast cancer. her Tyrer-Cuzick risk score is 20.9%.  For women with a greater than 20% lifetime risk of breast cancer, the [employer]  (NCCN) recommends the following:   1.      Clinical encounter every 6-12 months to begin when identified as being at increased risk, but not before age 54  2.      Annual mammograms. Tomosynthesis is recommended starting 10 years earlier than the youngest breast cancer diagnosis in the family or at age 11 (whichever comes first), but not before age 57    3.      Annual breast MRI starting 10 years earlier than the youngest breast cancer diagnosis in the family or at age 66 (whichever comes first), but not before age 85.   We, therefore, discussed that it is reasonable for Ms. Brunet to be followed by a high-risk  breast cancer clinic; in addition to a yearly mammogram and physical exam by a healthcare provider, she should discuss the usefulness of an annual breast MRI with the high-risk clinic providers. She has declined a referral to a high risk clinic at this time.  RECOMMENDATIONS FOR FAMILY MEMBERS:  Individuals in this family might be at some increased risk of developing cancer, over the general population risk, simply due to the family history of cancer.  We recommended women in this family have a yearly mammogram beginning at age 16, or 46 years younger than the earliest onset of cancer, an annual clinical breast exam, and perform monthly breast self-exams. Women in this family should also have a gynecological exam as recommended by their primary provider. All family members should be referred for colonoscopy starting at age 9.  FOLLOW-UP: Lastly, we discussed with Kelly Richard that cancer genetics is a rapidly advancing field and it is possible that new genetic tests will be appropriate for her and/or her family members in the future. We encouraged her to remain in contact with cancer genetics on an annual basis so we can update her personal and family histories and let her know of advances in cancer genetics that may benefit this family.   Our contact number was provided. Kelly Richard questions were answered to her satisfaction, and she knows she is welcome to call us at anytime with additional questions or concerns.   Roma Kayser, [place], [facility] Licensed, Certified Genetic Counselor [email or web address]

## 2021-02-10 ENCOUNTER — Other Ambulatory Visit: Payer: Self-pay | Admitting: Registered Nurse

## 2021-02-10 DIAGNOSIS — F419 Anxiety disorder, unspecified: Secondary | ICD-10-CM

## 2021-02-24 ENCOUNTER — Encounter: Payer: Self-pay | Admitting: Registered Nurse

## 2021-02-24 ENCOUNTER — Ambulatory Visit (INDEPENDENT_AMBULATORY_CARE_PROVIDER_SITE_OTHER): Payer: 59 | Admitting: Registered Nurse

## 2021-02-24 ENCOUNTER — Other Ambulatory Visit: Payer: Self-pay

## 2021-02-24 VITALS — BP 113/90 | HR 96 | Temp 98.1°F | Resp 18 | Ht 67.0 in | Wt 215.2 lb

## 2021-02-24 DIAGNOSIS — F419 Anxiety disorder, unspecified: Secondary | ICD-10-CM

## 2021-02-24 DIAGNOSIS — E018 Other iodine-deficiency related thyroid disorders and allied conditions: Secondary | ICD-10-CM

## 2021-02-24 LAB — TSH: TSH: 30.91 u[IU]/mL — ABNORMAL HIGH (ref 0.35–5.50)

## 2021-02-24 MED ORDER — ESCITALOPRAM OXALATE 20 MG PO TABS: 20.0000 mg | ORAL_TABLET | Freq: Every day | ORAL | 1 refills | Status: DC

## 2021-02-24 MED ORDER — ALPRAZOLAM 1 MG PO TABS: 1.0000 mg | ORAL_TABLET | Freq: Two times a day (BID) | ORAL | 0 refills | Status: DC | PRN

## 2021-02-24 NOTE — Patient Instructions (Addendum)
Great to see you! Glad things are heading in the right direction  Increase lexapro to 20mg  po qd Ok to continue alprazolam as you've been taking it  Will adjust synthroid based on today's result  See you in 6 mo at the latest!  Thank you  Rich     If you have lab work done today you will be contacted with your lab results within the next 2 weeks.  If you have not heard from then please contact us. The fastest way to get your results is to register for My Chart.   IF you received an x-ray today, you will receive an invoice from [facility]. Please contact [facility] at [phone] with questions or concerns regarding your invoice.   IF you received labwork today, you will receive an invoice from [facility]. Please contact [facility] at [phone] with questions or concerns regarding your invoice.   Our billing staff will not be able to assist you with questions regarding bills from these companies.  You will be contacted with the lab results as soon as they are available. The fastest way to get your results is to activate your My Chart account. Instructions are located on the last page of this paperwork. If you have not heard from [phone] regarding the results in 2 weeks, please contact this office.

## 2021-02-24 NOTE — Progress Notes (Signed)
Established Patient Office Visit  Subjective:  Patient ID: Kelly Richard, female    DOB: [birth year]  Age: 35 y.o. MRN: [id number]  CC:  Chief Complaint  Patient presents with   Follow-up    6 week follow up on medication check. Pt states she believes the medication is working.    HPI Kelly Richard presents for med recheck  Had restarted synthroid po qd, escitalopram 10mg  po qd, alprazolam prn - taking 1mg  po bid most days. Doing well since. No AE   Last tsh elevated. Will recheck today.  Lab Results  Component Value Date   TSH 29.990 (H) 12/03/2014   Overall much improved. No concerns at this time.  Notes she did see genetic counseling who showed no evidence of concerning genes.  Past Medical History:  Diagnosis Date   Anxiety    Degenerative disc disease    Depression    Family history of colon cancer    Family history of ovarian cancer    Family history of prostate cancer    History of substance abuse (HCC)    clean since 2015   Panic attacks    Postpartum depression    Thyroid disease    Vaginal Pap smear, abnormal     Past Surgical History:  Procedure Laterality Date   TUBAL LIGATION      Family History  Problem Relation Age of Onset   Breast cancer Mother 49       d. 85   Cervical cancer Mother 38       again at 8   Non-Hodgkin's lymphoma Father    Colon cancer Father        now in remission   Colon polyps Maternal Uncle    Cirrhosis Maternal Uncle    Other Maternal Grandmother        hemmorhaged after surgery   Stroke Maternal Grandfather    Emphysema Paternal Grandfather    COPD Paternal Grandfather    Ovarian cancer Other        MGF's mother   Prostate cancer Other        MGF's father    Social History   Socioeconomic History   Marital status: Married    Spouse name: Not on file   Number of children: Not on file   Years of education: Not on file   Highest education level: Not on file  Occupational History   Not on file   Tobacco Use   Smoking status: Every Day    Packs/day: 1.50    Years: 16.00    Pack years: 24.00    Types: Cigarettes    Start date: 2006   Smokeless tobacco: Never  Vaping Use   Vaping Use: Never used  Substance and Sexual Activity   Alcohol use: Yes    Comment: occas   Drug use: Yes    Types: Marijuana   Sexual activity: Yes    Birth control/protection: Surgical    Comment: tubal  Other Topics Concern   Not on file  Social History Narrative   Not on file   Social Determinants of Health   Financial Resource Strain: Not on file  Food Insecurity: Not on file  Transportation Needs: Not on file  Physical Activity: Not on file  Stress: Not on file  Social Connections: Not on file  Intimate Partner Violence: Not on file    Outpatient Medications Prior to Visit  Medication Sig Dispense Refill   hydrOXYzine (ATARAX/VISTARIL) 25 MG  tablet TAKE ONE TABLET (25 MG TOTAL) BY MOUTH 3TIMES DAILY AS NEEDED. 30 tablet 0   levothyroxine (SYNTHROID) 50 MCG tablet Take 1 tablet (50 mcg total) by mouth daily before breakfast. 30 tablet 1   ALPRAZolam (XANAX) 0.5 MG tablet TAKE ONE TABLET BY MOUTH TWICE DAILY AS NEEDED FOR ANXIETY 60 tablet 0   escitalopram (LEXAPRO) 10 MG tablet Take 1 tablet (10 mg total) by mouth daily. 90 tablet 0   No facility-administered medications prior to visit.    Allergies  Allergen Reactions   Amoxicillin Anaphylaxis   Penicillins Anaphylaxis    ROS Review of Systems  Constitutional: Negative.   HENT: Negative.    Eyes: Negative.   Respiratory: Negative.    Cardiovascular: Negative.   Gastrointestinal: Negative.   Genitourinary: Negative.   Musculoskeletal: Negative.   Skin: Negative.   Neurological: Negative.   Psychiatric/Behavioral: Negative.    All other systems reviewed and are negative.    Objective:    Physical Exam Vitals and nursing note reviewed.  Constitutional:      General: She is not in acute distress.    Appearance:  Normal appearance. She is normal weight. She is not ill-appearing, toxic-appearing or diaphoretic.  Cardiovascular:     Rate and Rhythm: Normal rate and regular rhythm.     Heart sounds: Normal heart sounds. No murmur heard.   No friction rub. No gallop.  Pulmonary:     Effort: Pulmonary effort is normal. No respiratory distress.     Breath sounds: Normal breath sounds. No stridor. No wheezing, rhonchi or rales.  Chest:     Chest wall: No tenderness.  Skin:    General: Skin is warm and dry.  Neurological:     General: No focal deficit present.     Mental Status: She is alert and oriented to person, place, and time. Mental status is at baseline.  Psychiatric:        Mood and Affect: Mood normal.        Behavior: Behavior normal.        Thought Content: Thought content normal.        Judgment: Judgment normal.    BP 113/90   Pulse 96   Temp 98.1 F (36.7 C) (Temporal)   Resp 18   Ht 5\' 7"  (1.702 m)   Wt 215 lb 3.2 oz (97.6 kg)   SpO2 97%   BMI 33.71 kg/m  Wt Readings from Last 3 Encounters:  02/24/21 215 lb 3.2 oz (97.6 kg)  01/11/21 208 lb 1.6 oz (94.4 kg)  03/21/19 158 lb (71.7 kg)     Health Maintenance Due  Topic Date Due   COVID-19 Vaccine (1) Never done   Pneumococcal Vaccine 13-47 Years old (1 - PCV) Never done   TETANUS/TDAP  Never done    There are no preventive care reminders to display for this patient.  Lab Results  Component Value Date   TSH 29.990 (H) 12/03/2014   Lab Results  Component Value Date   WBC 11.4 (H) 12/02/2014   HGB 14.0 12/02/2014   HCT 41.6 12/02/2014   MCV 93.1 12/02/2014   PLT 206 12/02/2014   Lab Results  Component Value Date   NA 137 12/02/2014   K 4.2 12/02/2014   CO2 25 12/02/2014   GLUCOSE 102 (H) 12/02/2014   BUN 16 12/02/2014   CREATININE 0.54 12/02/2014   BILITOT 0.6 12/02/2014   ALKPHOS 53 12/02/2014   AST 20 12/02/2014   ALT 14  12/02/2014   PROT 7.3 12/02/2014   ALBUMIN 4.2 12/02/2014   CALCIUM 8.5 (L)  12/02/2014   ANIONGAP 8 12/02/2014   No results found for: CHOL No results found for: HDL No results found for: LDLCALC No results found for: TRIG No results found for: CHOLHDL No results found for: ZOXW9U    Assessment & Plan:   Problem List Items Addressed This Visit       Endocrine   HYPOTHYROIDISM, POST-RADIOACTIVE IODINE   Relevant Orders   TSH   Other Visit Diagnoses     Anxiety    -  Primary   Relevant Medications   ALPRAZolam (XANAX) 1 MG tablet   escitalopram (LEXAPRO) 20 MG tablet       Meds ordered this encounter  Medications   ALPRAZolam (XANAX) 1 MG tablet    Sig: Take 1 tablet (1 mg total) by mouth 2 (two) times daily as needed for anxiety.    Dispense:  60 tablet    Refill:  0    Order Specific Question:   Supervising Provider    Answer:   Neva Seat, JEFFREY R [[id number]]   escitalopram (LEXAPRO) 20 MG tablet    Sig: Take 1 tablet (20 mg total) by mouth daily.    Dispense:  90 tablet    Refill:  1    Order Specific Question:   Supervising Provider    Answer:   Neva Seat, JEFFREY R [[id number]]    Follow-up: Return in about 6 months (around 08/24/2021) for med check - tsh.   PLAN Increase lexapro to 20mg  po qd Increase alprazolam to 1mg  po bid prn. Consider reducing this. Reviewed high risk medication use with patient who voices understanding. Pdmp consulted, no concerns.  Return in 6 mo for recheck Recheck TSH today and adjust synthroid as warranted Patient encouraged to call clinic with any questions, comments, or concerns.  , NP

## 2021-02-25 ENCOUNTER — Other Ambulatory Visit: Payer: Self-pay | Admitting: Registered Nurse

## 2021-02-25 DIAGNOSIS — E018 Other iodine-deficiency related thyroid disorders and allied conditions: Secondary | ICD-10-CM

## 2021-02-25 MED ORDER — LEVOTHYROXINE SODIUM 88 MCG PO TABS: 88.0000 ug | ORAL_TABLET | Freq: Every day | ORAL | 0 refills | Status: DC

## 2021-03-23 ENCOUNTER — Ambulatory Visit: Payer: 59 | Admitting: Registered Nurse

## 2021-03-23 ENCOUNTER — Other Ambulatory Visit: Payer: Self-pay | Admitting: Registered Nurse

## 2021-03-23 DIAGNOSIS — F419 Anxiety disorder, unspecified: Secondary | ICD-10-CM

## 2021-03-24 ENCOUNTER — Telehealth: Payer: Self-pay

## 2021-03-24 ENCOUNTER — Other Ambulatory Visit: Payer: Self-pay

## 2021-03-24 ENCOUNTER — Ambulatory Visit (INDEPENDENT_AMBULATORY_CARE_PROVIDER_SITE_OTHER): Payer: 59 | Admitting: Registered Nurse

## 2021-03-24 ENCOUNTER — Encounter: Payer: Self-pay | Admitting: Registered Nurse

## 2021-03-24 ENCOUNTER — Ambulatory Visit
Admission: RE | Admit: 2021-03-24 | Discharge: 2021-03-24 | Disposition: A | Discharge status: Home / Self Care | Payer: Self-pay | Source: Ambulatory Visit | Attending: Registered Nurse | Admitting: Registered Nurse

## 2021-03-24 VITALS — BP 109/74 | HR 78 | Temp 98.0°F | Resp 18 | Ht 67.0 in | Wt 214.0 lb

## 2021-03-24 DIAGNOSIS — M545 Low back pain, unspecified: Secondary | ICD-10-CM

## 2021-03-24 MED ORDER — DICLOFENAC SODIUM 75 MG PO TBEC: 75.0000 mg | DELAYED_RELEASE_TABLET | Freq: Two times a day (BID) | ORAL | 0 refills | Status: DC

## 2021-03-24 MED ORDER — CYCLOBENZAPRINE HCL 10 MG PO TABS: 10.0000 mg | ORAL_TABLET | Freq: Three times a day (TID) | ORAL | 0 refills | Status: DC | PRN

## 2021-03-24 NOTE — Telephone Encounter (Signed)
Caller name:Jazelle Helget   On DPR? :Yes  Call back number:[phone]  Provider they see: Richard   Reason for call:Pt is calling back following up on multiple calls regarding the ALPRAZolam (XANAX) 1 MG tablet

## 2021-03-24 NOTE — Telephone Encounter (Signed)
Caller name:Devita Veley   On DPR? :Yes  Call back number:[phone]  Provider they see: Richard  Reason for call:Pt is calling about ALPRAZolam Prudy Feeler) 1 MG tablet  said that Gerlene Burdock said he was going to give permission for RX to be filled today but the pharmacy said she can not pick up till the 22nd and she is calling back to find out why

## 2021-03-24 NOTE — Progress Notes (Signed)
Established Patient Office Visit  Subjective:  Patient ID: Kelly Richard, female    DOB: Oct 07, 1985  Age: 35 y.o. MRN: [id number]  CC:  Chief Complaint  Patient presents with   Fall    Patient states she fell down the steps hurt her ankle. Patient states she hurt her back as well and having trouble sitting the most.    HPI Kelly Richard presents for fall  Mechanical fall down set of steps. Landed hard on ankle and rear.  Happened before Thanksgiving. Ankle examined in Urgent Care, severe sprain  Healing well  Noted the day before Thanksgiving acute severe back pain on turning in the car to look behind her. Sharp pain in lower back  Seen by chiropractor, who suggests a possible S1 fracture Would like xray  Has been taking OTC analgesics with some relief.   No saddle anesthesia, myelopathy, or radiculopathy.  Past Medical History:  Diagnosis Date   Anxiety    Degenerative disc disease    Depression    Family history of colon cancer    Family history of ovarian cancer    Family history of prostate cancer    History of substance abuse (HCC)    clean since 2015   Panic attacks    Postpartum depression    Thyroid disease    Vaginal Pap smear, abnormal     Past Surgical History:  Procedure Laterality Date   TUBAL LIGATION      Family History  Problem Relation Age of Onset   Breast cancer Mother 35       d. 15   Cervical cancer Mother 25       again at 68   Non-Hodgkin's lymphoma Father    Colon cancer Father        now in remission   Colon polyps Maternal Uncle    Cirrhosis Maternal Uncle    Other Maternal Grandmother        hemmorhaged after surgery   Stroke Maternal Grandfather    Emphysema Paternal Grandfather    COPD Paternal Grandfather    Ovarian cancer Other        MGF's mother   Prostate cancer Other        MGF's father    Social History   Socioeconomic History   Marital status: Married    Spouse name: Not on file   Number of  children: Not on file   Years of education: Not on file   Highest education level: Not on file  Occupational History   Not on file  Tobacco Use   Smoking status: Every Day    Packs/day: 1.50    Years: 16.00    Pack years: 24.00    Types: Cigarettes    Start date: 2006   Smokeless tobacco: Never  Vaping Use   Vaping Use: Never used  Substance and Sexual Activity   Alcohol use: Yes    Comment: occas   Drug use: Yes    Types: Marijuana   Sexual activity: Yes    Birth control/protection: Surgical    Comment: tubal  Other Topics Concern   Not on file  Social History Narrative   Not on file   Social Determinants of Health   Financial Resource Strain: Not on file  Food Insecurity: Not on file  Transportation Needs: Not on file  Physical Activity: Not on file  Stress: Not on file  Social Connections: Not on file  Intimate Partner Violence: Not on file  Outpatient Medications Prior to Visit  Medication Sig Dispense Refill   [START ON 03/30/2021] ALPRAZolam (XANAX) 1 MG tablet TAKE ONE TABLET (1MG  TOTAL) BY MOUTH TWO TIMES DAILY AS NEEDED FOR ANXIETY 60 tablet 0   escitalopram (LEXAPRO) 20 MG tablet Take 1 tablet (20 mg total) by mouth daily. 90 tablet 1   hydrOXYzine (ATARAX) 25 MG tablet TAKE ONE TABLET (25 MG TOTAL) BY MOUTH 3TIMES DAILY AS NEEDED. 30 tablet 0   levothyroxine (SYNTHROID) 88 MCG tablet Take 1 tablet (88 mcg total) by mouth daily. 90 tablet 0   No facility-administered medications prior to visit.    Allergies  Allergen Reactions   Amoxicillin Anaphylaxis   Penicillins Anaphylaxis    ROS Review of Systems  Constitutional: Negative.   HENT: Negative.    Eyes: Negative.   Respiratory: Negative.    Cardiovascular: Negative.   Gastrointestinal: Negative.   Genitourinary: Negative.   Musculoskeletal:  Positive for back pain. Negative for arthralgias, gait problem, joint swelling, myalgias, neck pain and neck stiffness.  Skin: Negative.    Neurological: Negative.   Psychiatric/Behavioral: Negative.    All other systems reviewed and are negative.    Objective:    Physical Exam Vitals and nursing note reviewed.  Constitutional:      General: She is not in acute distress.    Appearance: Normal appearance. She is normal weight. She is not ill-appearing, toxic-appearing or diaphoretic.  Cardiovascular:     Rate and Rhythm: Normal rate and regular rhythm.     Heart sounds: Normal heart sounds. No murmur heard.   No friction rub. No gallop.  Pulmonary:     Effort: Pulmonary effort is normal. No respiratory distress.     Breath sounds: Normal breath sounds. No stridor. No wheezing, rhonchi or rales.  Chest:     Chest wall: No tenderness.  Musculoskeletal:        General: Tenderness (lower back midline) present. Normal range of motion.  Skin:    General: Skin is warm and dry.     Capillary Refill: Capillary refill takes less than 2 seconds.  Neurological:     General: No focal deficit present.     Mental Status: She is alert and oriented to person, place, and time. Mental status is at baseline.  Psychiatric:        Mood and Affect: Mood normal.        Behavior: Behavior normal.        Thought Content: Thought content normal.        Judgment: Judgment normal.    BP 109/74    Pulse 78    Temp 98 F (36.7 C) (Temporal)    Resp 18    Ht 5\' 7"  (1.702 m)    Wt 214 lb (97.1 kg)    BMI 33.52 kg/m  Wt Readings from Last 3 Encounters:  03/24/21 214 lb (97.1 kg)  02/24/21 215 lb 3.2 oz (97.6 kg)  01/11/21 208 lb 1.6 oz (94.4 kg)     Health Maintenance Due  Topic Date Due   COVID-19 Vaccine (1) Never done   Pneumococcal Vaccine 42-73 Years old (1 - PCV) Never done   TETANUS/TDAP  Never done    There are no preventive care reminders to display for this patient.  Lab Results  Component Value Date   TSH 30.91 (H) 02/24/2021   Lab Results  Component Value Date   WBC 11.4 (H) 12/02/2014   HGB 14.0 12/02/2014    HCT 41.6  12/02/2014   MCV 93.1 12/02/2014   PLT 206 12/02/2014   Lab Results  Component Value Date   NA 137 12/02/2014   K 4.2 12/02/2014   CO2 25 12/02/2014   GLUCOSE 102 (H) 12/02/2014   BUN 16 12/02/2014   CREATININE 0.54 12/02/2014   BILITOT 0.6 12/02/2014   ALKPHOS 53 12/02/2014   AST 20 12/02/2014   ALT 14 12/02/2014   PROT 7.3 12/02/2014   ALBUMIN 4.2 12/02/2014   CALCIUM 8.5 (L) 12/02/2014   ANIONGAP 8 12/02/2014   No results found for: CHOL No results found for: HDL No results found for: LDLCALC No results found for: TRIG No results found for: CHOLHDL No results found for: MVEH2C    Assessment & Plan:   Problem List Items Addressed This Visit   None Visit Diagnoses     Acute midline low back pain without sciatica    -  Primary   Relevant Medications   diclofenac (VOLTAREN) 75 MG EC tablet   cyclobenzaprine (FLEXERIL) 10 MG tablet   Other Relevant Orders   DG Lumbar Spine Complete       Meds ordered this encounter  Medications   diclofenac (VOLTAREN) 75 MG EC tablet    Sig: Take 1 tablet (75 mg total) by mouth 2 (two) times daily.    Dispense:  30 tablet    Refill:  0    Order Specific Question:   Supervising Provider    Answer:   Neva Seat, JEFFREY R [[id number]]   cyclobenzaprine (FLEXERIL) 10 MG tablet    Sig: Take 1 tablet (10 mg total) by mouth 3 (three) times daily as needed for muscle spasms.    Dispense:  30 tablet    Refill:  0    Order Specific Question:   Supervising Provider    Answer:   Neva Seat, JEFFREY R [[id number]]    Follow-up: Return if symptoms worsen or fail to improve.   PLAN Dg lumbar spine two view.  Continue with chiropractor. If changes on Xray warrant, will refer to ortho. Diclofenac and flexeril for pain. Discussed safe use. Patient encouraged to call clinic with any questions, comments, or concerns.   Janeece Agee, NP

## 2021-03-24 NOTE — Patient Instructions (Signed)
Head on down to [facility] for a lower back xray.  They are at [address] Their phone # is [phone]  Take diclofenac 1-2 times each day as needed for pain. Do not take with other NSAIDs (ibuprofen, aspirin, naproxen) Take flexeril 5-10mg  (1/2-1 tablet) up to three times daily as needed. May be mildly sedative.  I'll be in touch with results  Thanks,  Rich

## 2021-03-25 NOTE — Telephone Encounter (Signed)
Patient would like to get the prescription earlier that 03/30/2021 because she will be out before then.

## 2021-03-25 NOTE — Telephone Encounter (Signed)
Called and spoke with patient and she stated that she was confused and has now realized that she has enough medication to last until the new date for her to pick up

## 2021-03-26 ENCOUNTER — Other Ambulatory Visit: Payer: Self-pay | Admitting: Registered Nurse

## 2021-04-28 ENCOUNTER — Other Ambulatory Visit: Payer: Self-pay | Admitting: Registered Nurse

## 2021-04-28 DIAGNOSIS — F419 Anxiety disorder, unspecified: Secondary | ICD-10-CM

## 2021-04-28 NOTE — Telephone Encounter (Signed)
Patient is requesting a refill of the following medications: Requested Prescriptions   Pending Prescriptions Disp Refills   ALPRAZolam (XANAX) 1 MG tablet [Pharmacy Med Name: ALPRAZOLAM 1 MG TAB] 60 tablet     Sig: TAKE ONE TABLET (1MG  TOTAL) BY MOUTH TWO TIMES DAILY AS NEEDED FOR ANXIETY   hydrOXYzine (ATARAX) 25 MG tablet [Pharmacy Med Name: HYDROXYZINE HCL 25 MG TAB] 30 tablet 0    Sig: TAKE ONE TABLET (25 MG TOTAL) BY MOUTH 3TIMES DAILY AS NEEDED.    Date of patient request: 04/18/21 Last office visit: 03/24/21 Date of last refill: 03/30/21, 03/23/21 Last refill amount: 60, 30

## 2021-05-25 ENCOUNTER — Other Ambulatory Visit: Payer: Self-pay | Admitting: Registered Nurse

## 2021-05-25 DIAGNOSIS — M545 Low back pain, unspecified: Secondary | ICD-10-CM

## 2021-05-25 DIAGNOSIS — F419 Anxiety disorder, unspecified: Secondary | ICD-10-CM

## 2021-05-25 NOTE — Telephone Encounter (Signed)
Patient is requesting a refill of the following medications: Requested Prescriptions   Pending Prescriptions Disp Refills   cyclobenzaprine (FLEXERIL) 10 MG tablet [Pharmacy Med Name: CYCLOBENZAPRINE HCL 10 MG TAB] 30 tablet 0    Sig: TAKE ONE TABLET (10MG  TOTAL) BY MOUTH THREE TIMES DAILY AS NEEDED FOR MUSCLE SPASMS   ALPRAZolam (XANAX) 1 MG tablet [Pharmacy Med Name: ALPRAZOLAM 1 MG TAB] 60 tablet     Sig: TAKE ONE TABLET (1MG  TOTAL) BY MOUTH TWO TIMES DAILY AS NEEDED FOR ANXIETY   hydrOXYzine (ATARAX) 25 MG tablet [Pharmacy Med Name: HYDROXYZINE HCL 25 MG TAB] 30 tablet 0    Sig: TAKE ONE TABLET (25 MG TOTAL) BY MOUTH 3TIMES DAILY AS NEEDED.    Date of patient request: 05/25/21 Last office visit: 03/24/2021 Date of last refill: 03/24/21, 04/28/2021  Last refill amount: 30, 60

## 2021-06-09 IMAGING — MG DIGITAL DIAGNOSTIC BILAT W/ TOMO W/ CAD
6 of 10 series · 6 of 30 positions shown · non-contrast
Comparison: None.

CLINICAL DATA: 33-year-old patient with recent palpable area of
concern in the upper-outer quadrant of the right breast. She denies
any nipple discharge. Her mother was diagnosed with breast cancer at
age 35 died at age 43.

EXAM:
DIGITAL DIAGNOSTIC BILATERAL MAMMOGRAM WITH CAD AND TOMO
ULTRASOUND RIGHT BREAST

[R CC synth-2D]
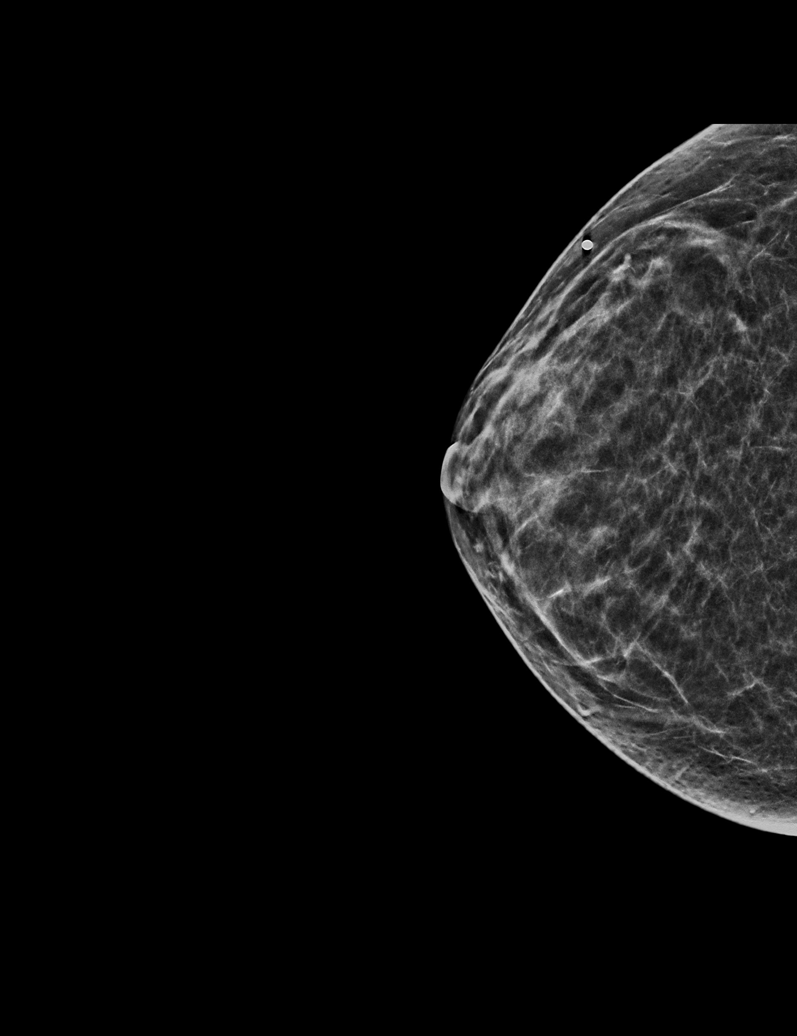

[L MLO synth-2D]
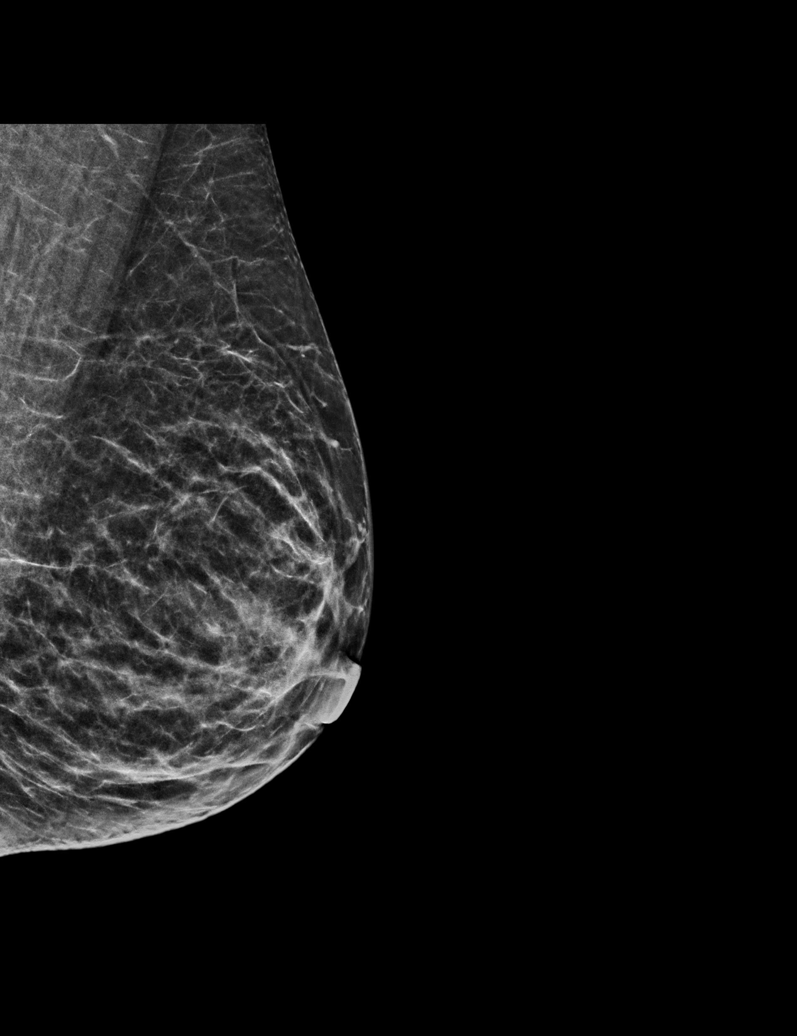

[L CC synth-2D]
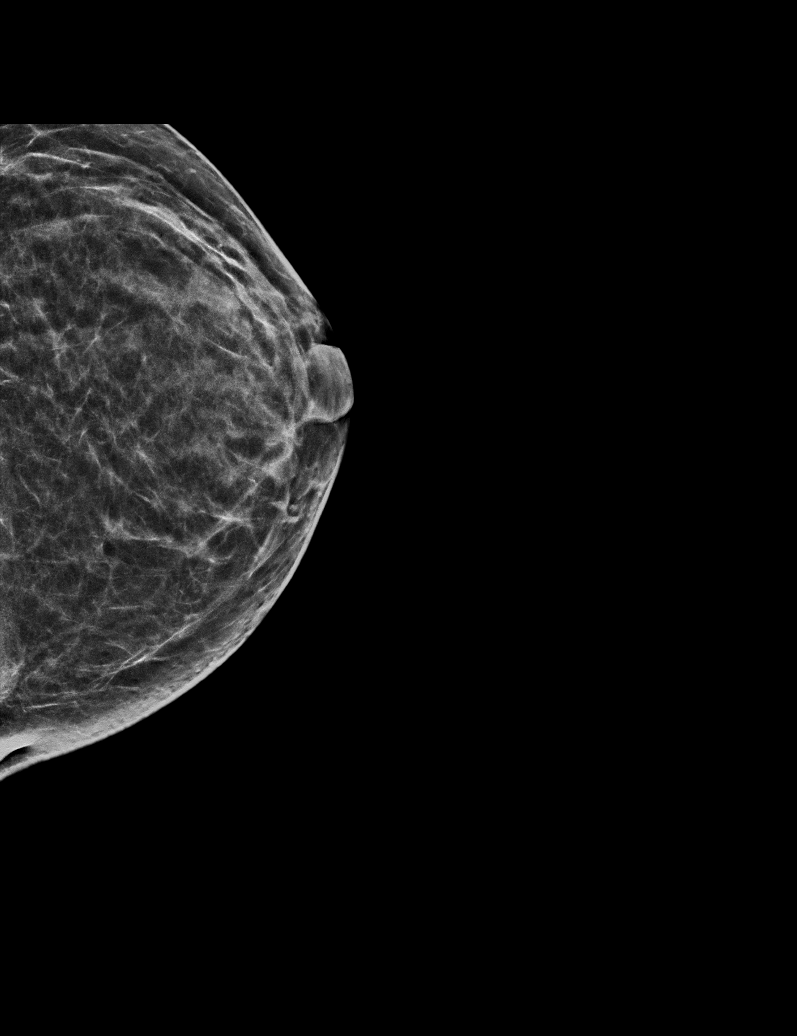

[R MLO synth-2D]
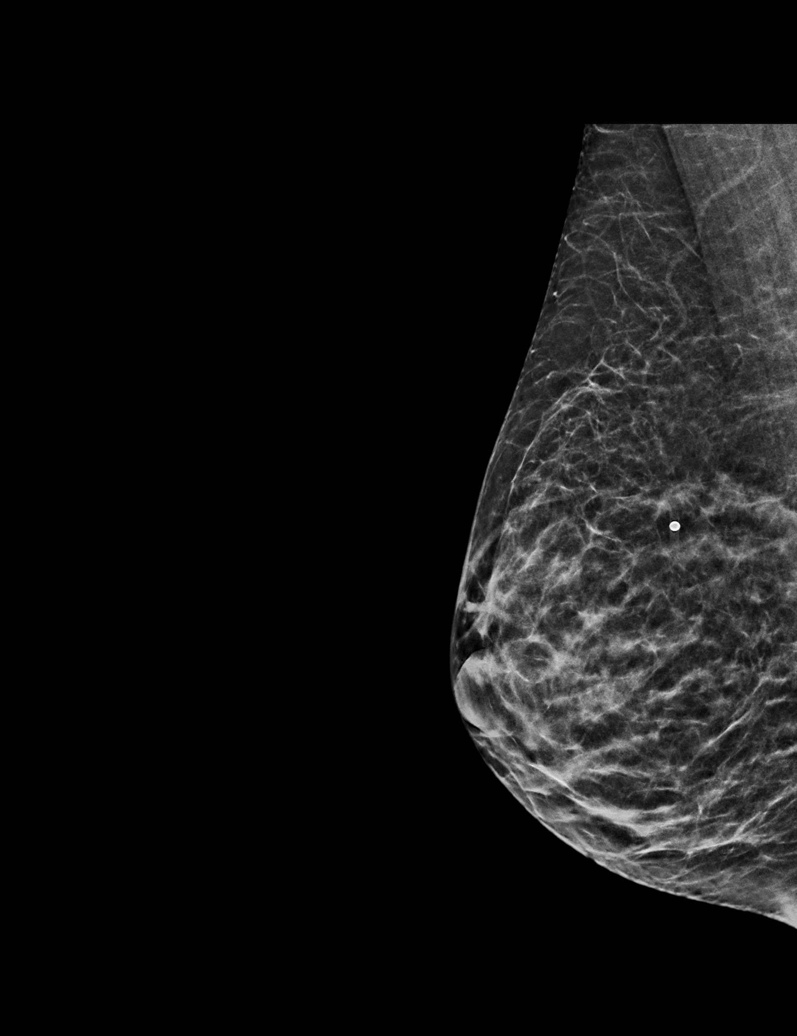

[R TAN synth-2D]
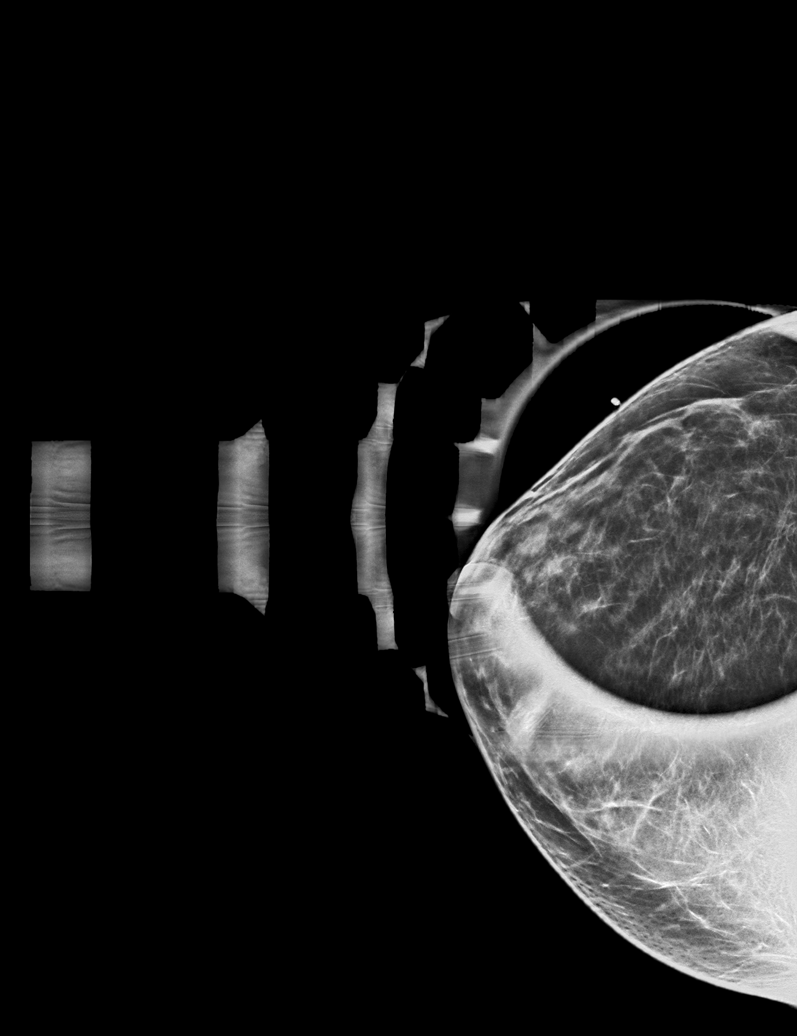

[L MLO tomo · tomo slice 25/50.0]
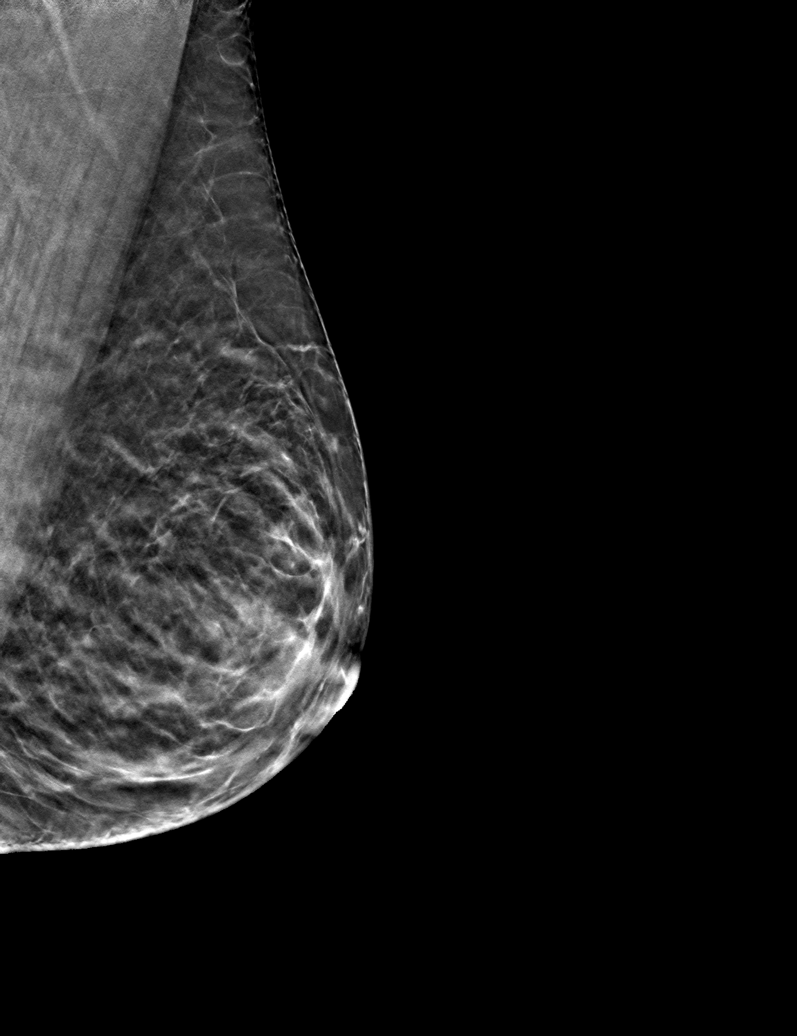

[6 of 30 positions shown; findings below may reference images not displayed]

Baseline mammogram.

ACR Breast Density Category c: The breast tissue is heterogeneously
dense, which may obscure small masses.
FINDINGS: No mass, architectural distortion, or suspicious microcalcification
is identified to suggest malignancy in either breast. Spot
compression view of the region of palpable concern in the
upper-outer right breast shows normal fibroglandular tissue with no
suspicious findings.

Mammographic images were processed with CAD.

On physical exam, palpate mobile glandular tissue in the upper-outer
right breast. I do not palpate a suspicious mass. The skin of the
right breast appears normal.

Targeted ultrasound is performed, showing normal fibroglandular
tissue in the upper-outer quadrant of the right breast. No
suspicious findings on ultrasound.
IMPRESSION: No evidence of malignancy in either breast.

RECOMMENDATION:
Screening mammogram in one year.(Code:YA-K-6MB). Given that the
patient's mother was diagnosed with breast cancer at age 35, annual
screening mammography is recommended.

I have discussed the findings and recommendations with the patient.
If applicable, a reminder letter will be sent to the patient
regarding the next appointment.

BI-RADS CATEGORY  1: Negative.

## 2021-06-22 ENCOUNTER — Other Ambulatory Visit: Payer: Self-pay | Admitting: Registered Nurse

## 2021-06-22 DIAGNOSIS — F419 Anxiety disorder, unspecified: Secondary | ICD-10-CM

## 2021-06-22 DIAGNOSIS — E018 Other iodine-deficiency related thyroid disorders and allied conditions: Secondary | ICD-10-CM

## 2021-06-22 DIAGNOSIS — M545 Low back pain, unspecified: Secondary | ICD-10-CM

## 2021-07-22 ENCOUNTER — Other Ambulatory Visit: Payer: Self-pay | Admitting: Registered Nurse

## 2021-07-22 DIAGNOSIS — M545 Low back pain, unspecified: Secondary | ICD-10-CM

## 2021-07-22 DIAGNOSIS — F419 Anxiety disorder, unspecified: Secondary | ICD-10-CM

## 2021-08-24 ENCOUNTER — Ambulatory Visit (INDEPENDENT_AMBULATORY_CARE_PROVIDER_SITE_OTHER): Payer: 59 | Admitting: Registered Nurse

## 2021-08-24 ENCOUNTER — Encounter: Payer: Self-pay | Admitting: Registered Nurse

## 2021-08-24 ENCOUNTER — Other Ambulatory Visit: Payer: Self-pay

## 2021-08-24 ENCOUNTER — Other Ambulatory Visit: Payer: Self-pay | Admitting: Registered Nurse

## 2021-08-24 VITALS — BP 124/64 | HR 68 | Temp 98.2°F | Resp 18 | Ht 67.0 in | Wt 225.0 lb

## 2021-08-24 DIAGNOSIS — Z13228 Encounter for screening for other metabolic disorders: Secondary | ICD-10-CM

## 2021-08-24 DIAGNOSIS — Z111 Encounter for screening for respiratory tuberculosis: Secondary | ICD-10-CM

## 2021-08-24 DIAGNOSIS — Z13220 Encounter for screening for lipoid disorders: Secondary | ICD-10-CM

## 2021-08-24 DIAGNOSIS — E018 Other iodine-deficiency related thyroid disorders and allied conditions: Secondary | ICD-10-CM

## 2021-08-24 DIAGNOSIS — Z130 Encounter for screening for diseases of the blood and blood-forming organs and certain disorders involving the immune mechanism: Secondary | ICD-10-CM | POA: Diagnosis not present

## 2021-08-24 DIAGNOSIS — F419 Anxiety disorder, unspecified: Secondary | ICD-10-CM

## 2021-08-24 DIAGNOSIS — Z23 Encounter for immunization: Secondary | ICD-10-CM | POA: Diagnosis not present

## 2021-08-24 DIAGNOSIS — Z1329 Encounter for screening for other suspected endocrine disorder: Secondary | ICD-10-CM

## 2021-08-24 LAB — CBC WITH DIFFERENTIAL/PLATELET
Basophils Absolute: 0.1 10*3/uL (ref 0.0–0.1)
Basophils Relative: 0.7 % (ref 0.0–3.0)
Eosinophils Absolute: 0 10*3/uL (ref 0.0–0.7)
Eosinophils Relative: 0.5 % (ref 0.0–5.0)
HCT: 44.8 % (ref 36.0–46.0)
Hemoglobin: 15.1 g/dL — ABNORMAL HIGH (ref 12.0–15.0)
Lymphocytes Relative: 9.3 % — ABNORMAL LOW (ref 12.0–46.0)
Lymphs Abs: 0.9 10*3/uL (ref 0.7–4.0)
MCHC: 33.7 g/dL (ref 30.0–36.0)
MCV: 90.8 fl (ref 78.0–100.0)
Monocytes Absolute: 0.5 10*3/uL (ref 0.1–1.0)
Monocytes Relative: 5.3 % (ref 3.0–12.0)
Neutro Abs: 8.2 10*3/uL — ABNORMAL HIGH (ref 1.4–7.7)
Neutrophils Relative %: 84.2 % — ABNORMAL HIGH (ref 43.0–77.0)
Platelets: 211 10*3/uL (ref 150.0–400.0)
RBC: 4.94 Mil/uL (ref 3.87–5.11)
RDW: 13.3 % (ref 11.5–15.5)
WBC: 9.7 10*3/uL (ref 4.0–10.5)

## 2021-08-24 LAB — COMPREHENSIVE METABOLIC PANEL
ALT: 16 U/L (ref 0–35)
AST: 18 U/L (ref 0–37)
Albumin: 4.3 g/dL (ref 3.5–5.2)
Alkaline Phosphatase: 74 U/L (ref 39–117)
BUN: 11 mg/dL (ref 6–23)
CO2: 28 mEq/L (ref 19–32)
Calcium: 9.2 mg/dL (ref 8.4–10.5)
Chloride: 101 mEq/L (ref 96–112)
Creatinine, Ser: 0.68 mg/dL (ref 0.40–1.20)
GFR: 110.83 mL/min (ref >60.00)
Glucose, Bld: 114 mg/dL — ABNORMAL HIGH (ref 70–99)
Potassium: 4.1 mEq/L (ref 3.5–5.1)
Sodium: 138 mEq/L (ref 135–145)
Total Bilirubin: 0.5 mg/dL (ref 0.2–1.2)
Total Protein: 6.9 g/dL (ref 6.0–8.3)

## 2021-08-24 LAB — LIPID PANEL
Cholesterol: 161 mg/dL (ref 0–200)
HDL: 45 mg/dL (ref >39.00)
LDL Cholesterol: 96 mg/dL (ref 0–99)
NonHDL: 116.11
Total CHOL/HDL Ratio: 4
Triglycerides: 101 mg/dL (ref 0.0–149.0)
VLDL: 20.2 mg/dL (ref 0.0–40.0)

## 2021-08-24 LAB — HEMOGLOBIN A1C: Hgb A1c MFr Bld: 5.6 % (ref 4.6–6.5)

## 2021-08-24 LAB — T4, FREE: Free T4: 0.41 ng/dL — ABNORMAL LOW (ref 0.60–1.60)

## 2021-08-24 LAB — TSH: TSH: 33.71 u[IU]/mL — ABNORMAL HIGH (ref 0.35–5.50)

## 2021-08-24 MED ORDER — ALPRAZOLAM 1 MG PO TABS: ORAL_TABLET | ORAL | 0 refills | Status: DC

## 2021-08-24 MED ORDER — LEVOTHYROXINE SODIUM 88 MCG PO TABS: ORAL_TABLET | ORAL | 0 refills | Status: DC

## 2021-08-24 MED ORDER — LEVOTHYROXINE SODIUM 100 MCG PO TABS: 100.0000 ug | ORAL_TABLET | Freq: Every day | ORAL | 0 refills | Status: DC

## 2021-08-24 MED ORDER — ESCITALOPRAM OXALATE 20 MG PO TABS: 20.0000 mg | ORAL_TABLET | Freq: Every day | ORAL | 1 refills | Status: DC

## 2021-08-24 NOTE — Patient Instructions (Addendum)
Great to see you!  ? ?We'll check on labs and I'll let you know how results look ? ?Call with concerns ? ?If labs stable, see you in 6 mo ? ?Thanks, ? ?Rich  ? ? ? ?

## 2021-08-24 NOTE — Assessment & Plan Note (Signed)
Clinically stable. Check labs and adjust dosing as warranted.  ?

## 2021-08-24 NOTE — Assessment & Plan Note (Signed)
Continue current regimen. Reviewed risks, benefits, and side effects, pt voices understanding. ?Follow up q40mo ?

## 2021-08-24 NOTE — Progress Notes (Signed)
? ?Established Patient Office Visit ? ?Subjective:  ?Patient ID: Kelly Richard, female    DOB: [birth year]  Age: 36 y.o. MRN: [id number] ? ?CC:  ?Chief Complaint  ?Patient presents with  ? Medication Refill  ?  Patient states she is here for follow up medications and and TB skin test  ? ? ?HPI ?Kelly Richard presents for follow up  ? ?Hypothyroidism ?On synthroid  ?Feeling well.  ?Denies symptoms of thyroid dysfunction. ? ?Anxiety and Depression ?Doing well with alprazolam and lexapro ?No AE ?Hopes to continue ?Pdmp consulted, no concerns ?Advised on risks of benzodiazepines. ? ?TB screening ?Needed for employer. ?Has not tested positive previously ?Not in high risk living situation or subject to any immune deficiencies ? ?Tdap need ?Overdue ?Willing to get today ?No AE in the past. ? ?Outpatient Medications Prior to Visit  ?Medication Sig Dispense Refill  ? cyclobenzaprine (FLEXERIL) 10 MG tablet TAKE ONE TABLET (10MG  TOTAL) BY MOUTH THREE TIMES DAILY AS NEEDED FOR MUSCLE SPASMS 30 tablet 0  ? diclofenac (VOLTAREN) 75 MG EC tablet TAKE ONE TABLET (75MG  TOTAL) BY MOUTH TWO TIMES DAILY 30 tablet 0  ? hydrOXYzine (ATARAX) 25 MG tablet TAKE ONE TABLET (25 MG TOTAL) BY MOUTH 3TIMES DAILY AS NEEDED. 30 tablet 0  ? ALPRAZolam (XANAX) 1 MG tablet TAKE ONE TABLET (1MG  TOTAL) BY MOUTH TWO TIMES DAILY AS NEEDED FOR ANXIETY 60 tablet 0  ? escitalopram (LEXAPRO) 20 MG tablet Take 1 tablet (20 mg total) by mouth daily. 90 tablet 1  ? levothyroxine (SYNTHROID) 88 MCG tablet TAKE ONE TABLET ( TOTAL) BY MOUTH DAILY 90 tablet 0  ? ?No facility-administered medications prior to visit.  ? ? ?Review of Systems  ?Constitutional: Negative.   ?HENT: Negative.    ?Eyes: Negative.   ?Respiratory: Negative.    ?Cardiovascular: Negative.   ?Gastrointestinal: Negative.   ?Genitourinary: Negative.   ?Musculoskeletal: Negative.   ?Skin: Negative.   ?Neurological: Negative.   ?Psychiatric/Behavioral: Negative.    ?All other systems  reviewed and are negative. ? ?  ?Objective:  ?  ? ?BP 124/64   Pulse 68   Temp 98.2 ?F (36.8 ?C) (Temporal)   Resp 18   Ht 5\' 7"  (1.702 m)   Wt 225 lb (102.1 kg)   SpO2 98%   BMI 35.24 kg/m?  ? ?Wt Readings from Last 3 Encounters:  ?08/24/21 225 lb (102.1 kg)  ?03/24/21 214 lb (97.1 kg)  ?02/24/21 215 lb 3.2 oz (97.6 kg)  ? ?Physical Exam ?Vitals and nursing note reviewed.  ?Constitutional:   ?   General: She is not in acute distress. ?   Appearance: Normal appearance. She is normal weight. She is not ill-appearing, toxic-appearing or diaphoretic.  ?Cardiovascular:  ?   Rate and Rhythm: Normal rate and regular rhythm.  ?   Heart sounds: Normal heart sounds. No murmur heard. ?  No friction rub. No gallop.  ?Pulmonary:  ?   Effort: Pulmonary effort is normal. No respiratory distress.  ?   Breath sounds: Normal breath sounds. No stridor. No wheezing, rhonchi or rales.  ?Chest:  ?   Chest wall: No tenderness.  ?Skin: ?   General: Skin is warm and dry.  ?Neurological:  ?   General: No focal deficit present.  ?   Mental Status: She is alert and oriented to person, place, and time. Mental status is at baseline.  ?Psychiatric:     ?   Mood and Affect: Mood normal.     ?  Behavior: Behavior normal.     ?   Thought Content: Thought content normal.     ?   Judgment: Judgment normal.  ? ? ?No results found for any visits on 08/24/21. ? ? ? ?The ASCVD Risk score (Arnett DK, et al., 2019) failed to calculate for the following reasons: ?  The 2019 ASCVD risk score is only valid for ages 91 to 37 ? ?  ?Assessment & Plan:  ? ?Problem List Items Addressed This Visit   ? ?  ? Endocrine  ? HYPOTHYROIDISM, POST-RADIOACTIVE IODINE - Primary  ?  Clinically stable. Check labs and adjust dosing as warranted.  ? ?  ?  ? Relevant Medications  ? levothyroxine (SYNTHROID) 88 MCG tablet  ? Other Relevant Orders  ? T4, free  ? CBC with Differential/Platelet  ? Comprehensive metabolic panel  ? Hemoglobin A1c  ? Lipid panel  ? TSH  ?  ?  Other  ? Anxiety  ?  Continue current regimen. Reviewed risks, benefits, and side effects, pt voices understanding. ?Follow up q71mo ? ?  ?  ? Relevant Medications  ? escitalopram (LEXAPRO) 20 MG tablet  ? ALPRAZolam (XANAX) 1 MG tablet  ? ?Other Visit Diagnoses   ? ? Need for diphtheria-tetanus-pertussis (Tdap) vaccine      ? Relevant Orders  ? Tdap vaccine greater than or equal to 7yo IM  ? Screening for tuberculosis      ? Relevant Orders  ? QuantiFERON-TB Gold Plus  ? Screening for endocrine, metabolic and immunity disorder      ? Relevant Orders  ? CBC with Differential/Platelet  ? Comprehensive metabolic panel  ? Hemoglobin A1c  ? TSH  ? Lipid screening      ? Relevant Orders  ? Lipid panel  ? ?  ? ? ?Meds ordered this encounter  ?Medications  ? escitalopram (LEXAPRO) 20 MG tablet  ?  Sig: Take 1 tablet (20 mg total) by mouth daily.  ?  Dispense:  90 tablet  ?  Refill:  1  ?  Order Specific Question:   Supervising Provider  ?  Answer:   Neva Seat, JEFFREY R [[id number]]  ? levothyroxine (SYNTHROID) 88 MCG tablet  ?  Sig: TAKE ONE TABLET ( TOTAL) BY MOUTH DAILY  ?  Dispense:  90 tablet  ?  Refill:  0  ?  Order Specific Question:   Supervising Provider  ?  Answer:   Neva Seat, JEFFREY R [[id number]]  ? ALPRAZolam (XANAX) 1 MG tablet  ?  Sig: TAKE ONE TABLET (1MG  TOTAL) BY MOUTH TWO TIMES DAILY AS NEEDED FOR ANXIETY  ?  Dispense:  60 tablet  ?  Refill:  0  ?  Order Specific Question:   Supervising Provider  ?  Answer:   , JEFFREY R [[id number]]  ? ? ?Return in about 6 weeks (around 10/05/2021) for Chronic Conditions.  ? ?PLAN ?Screen for tb ?Screening labs collected ?Tdap given ? ?10/07/2021, NP ?

## 2021-08-28 LAB — QUANTIFERON-TB GOLD PLUS
Mitogen-NIL: >10 IU/mL
NIL: 0.04 IU/mL
QuantiFERON-TB Gold Plus: NEGATIVE
TB1-NIL: 0.01 IU/mL
TB2-NIL: 0.01 IU/mL

## 2021-09-23 ENCOUNTER — Other Ambulatory Visit: Payer: Self-pay | Admitting: Registered Nurse

## 2021-09-23 DIAGNOSIS — F419 Anxiety disorder, unspecified: Secondary | ICD-10-CM

## 2021-09-23 NOTE — Telephone Encounter (Signed)
Refill request as PCP out of town.  Controlled substance database (PDMP) reviewed. No concerns appreciated.  Last filled 5/16 for alprazolam and note reviewed from prescribing provider May 16.  Alprazolam refill ordered.  Hydroxyzine refill ordered

## 2021-09-23 NOTE — Telephone Encounter (Signed)
Patient is requesting a refill of the following medications: Requested Prescriptions   Pending Prescriptions Disp Refills   ALPRAZolam (XANAX) 1 MG tablet [Pharmacy Med Name: ALPRAZOLAM 1 MG TAB] 60 tablet     Sig: TAKE ONE TABLET (1MG  TOTAL) BY MOUTH TWOTIMES DAILY AS NEEDED FOR ANXIETY   hydrOXYzine (ATARAX) 25 MG tablet [Pharmacy Med Name: HYDROXYZINE HCL 25 MG TAB] 30 tablet 0    Sig: TAKE ONE TABLET (25 MG TOTAL) BY MOUTH 3TIMES DAILY AS NEEDED.    Date of patient request: 09/23/21 Last office visit: 08/24/21 Date of last refill: 08/24/21 Last refill amount: 60

## 2021-10-22 ENCOUNTER — Other Ambulatory Visit: Payer: Self-pay | Admitting: Family Medicine

## 2021-10-22 ENCOUNTER — Other Ambulatory Visit: Payer: Self-pay | Admitting: Registered Nurse

## 2021-10-22 ENCOUNTER — Other Ambulatory Visit: Payer: Self-pay

## 2021-10-22 DIAGNOSIS — F419 Anxiety disorder, unspecified: Secondary | ICD-10-CM

## 2021-10-22 DIAGNOSIS — E018 Other iodine-deficiency related thyroid disorders and allied conditions: Secondary | ICD-10-CM

## 2021-10-22 MED ORDER — HYDROXYZINE HCL 25 MG PO TABS: ORAL_TABLET | ORAL | 3 refills | Status: DC

## 2021-10-25 ENCOUNTER — Other Ambulatory Visit: Payer: Self-pay | Admitting: Registered Nurse

## 2021-10-25 DIAGNOSIS — F419 Anxiety disorder, unspecified: Secondary | ICD-10-CM

## 2021-10-25 NOTE — Telephone Encounter (Signed)
Encourage patient to contact the pharmacy for refills or they can request refills through [facility]  (Please schedule appointment if patient has not been seen in over a year)    WHAT PHARMACY WOULD THEY LIKE THIS SENT TO: [facility] [phone]  MEDICATION NAME & DOSE: alprazolam 1 mg   NOTES/COMMENTS FROM PATIENT:      Front office please notify patient: It takes 48-72 hours to process rx refill requests Ask patient to call pharmacy to ensure rx is ready before heading there.

## 2021-10-26 ENCOUNTER — Other Ambulatory Visit: Payer: Self-pay | Admitting: Registered Nurse

## 2021-10-26 ENCOUNTER — Encounter: Payer: Self-pay | Admitting: Registered Nurse

## 2021-10-26 DIAGNOSIS — F419 Anxiety disorder, unspecified: Secondary | ICD-10-CM

## 2021-10-26 MED ORDER — ALPRAZOLAM 1 MG PO TABS: ORAL_TABLET | ORAL | 0 refills | Status: DC

## 2021-10-26 NOTE — Telephone Encounter (Signed)
Encourage patient to contact the pharmacy for refills or they can request refills through [facility]  (Please schedule appointment if patient has not been seen in over a year)    WHAT PHARMACY WOULD THEY LIKE THIS SENT TO: [facility] [phone]  MEDICATION NAME & DOSE: alprazolam 1 mg  NOTES/COMMENTS FROM PATIENT:pt calling back again stating that pharmacy has not received refill request and that she has been out since Saturday.  Please call pt once called into pharmacy. [phone]      Front office please notify patient: It takes 48-72 hours to process rx refill requests Ask patient to call pharmacy to ensure rx is ready before heading there.

## 2021-10-26 NOTE — Telephone Encounter (Signed)
Pt calling again about rx refill for alprazolam 1 mg; pt is completely out and is having severe anxiety and shaking. Pt needs refill done today.

## 2021-10-26 NOTE — Telephone Encounter (Signed)
Last visit on 08/24/21  Last refill on 09/23/21 for #60 (30 day supply) per pdmp. Pt aware of risks of benzodiazepine use.  Jari Sportsman, NP

## 2021-10-26 NOTE — Telephone Encounter (Signed)
Patient is requesting a refill of the following medications: Requested Prescriptions   Pending Prescriptions Disp Refills   ALPRAZolam (XANAX) 1 MG tablet 60 tablet 0    Sig: TAKE ONE TABLET (1MG  TOTAL) BY MOUTH TWOTIMES DAILY AS NEEDED FOR ANXIETY    Date of patient request: 10/26/21 Last office visit: 08/24/21 Date of last refill: 09/23/21 Last refill amount: 60

## 2021-10-27 ENCOUNTER — Other Ambulatory Visit: Payer: Self-pay | Admitting: Registered Nurse

## 2021-10-27 DIAGNOSIS — F419 Anxiety disorder, unspecified: Secondary | ICD-10-CM

## 2021-10-27 MED ORDER — HYDROXYZINE HCL 25 MG PO TABS: ORAL_TABLET | ORAL | 3 refills | Status: DC

## 2021-11-26 ENCOUNTER — Telehealth: Payer: Self-pay | Admitting: Registered Nurse

## 2021-11-26 ENCOUNTER — Other Ambulatory Visit: Payer: Self-pay | Admitting: Lab

## 2021-11-26 DIAGNOSIS — F419 Anxiety disorder, unspecified: Secondary | ICD-10-CM

## 2021-11-26 DIAGNOSIS — M545 Low back pain, unspecified: Secondary | ICD-10-CM

## 2021-11-26 MED ORDER — HYDROXYZINE HCL 25 MG PO TABS: ORAL_TABLET | ORAL | 3 refills | Status: DC

## 2021-11-26 MED ORDER — ALPRAZOLAM 1 MG PO TABS: ORAL_TABLET | ORAL | 0 refills | Status: DC

## 2021-11-26 MED ORDER — CYCLOBENZAPRINE HCL 10 MG PO TABS: ORAL_TABLET | ORAL | 0 refills | Status: DC

## 2021-11-26 NOTE — Telephone Encounter (Signed)
L/V  08/24/21 Alprazolam 60 tab 10/26/21

## 2021-11-26 NOTE — Telephone Encounter (Signed)
Prescriptions sent to pharmacy

## 2021-11-26 NOTE — Telephone Encounter (Signed)
Encourage patient to contact the pharmacy for refills or they can request refills through [facility]  (Please schedule appointment if patient has not been seen in over a year)    WHAT PHARMACY WOULD THEY LIKE THIS SENT TO: [facility] - [place], [place] - [address]  MEDICATION NAME & DOSE: Hydroxyzine 25 mg, Cyclobenzaprine 10 mg and Alprazolam 1 mg.  NOTES/COMMENTS FROM PATIENT: Gerlene Burdock PT) Pt states that she need refills on these medications.       Front office please notify patient: It takes 48-72 hours to process rx refill requests Ask patient to call pharmacy to ensure rx is ready before heading there.

## 2021-12-23 ENCOUNTER — Other Ambulatory Visit: Payer: Self-pay | Admitting: Family Medicine

## 2021-12-23 DIAGNOSIS — F419 Anxiety disorder, unspecified: Secondary | ICD-10-CM

## 2021-12-23 MED ORDER — ALPRAZOLAM 1 MG PO TABS: ORAL_TABLET | ORAL | 0 refills | Status: DC

## 2021-12-23 NOTE — Telephone Encounter (Signed)
Patient is requesting a refill of the following medications: Requested Prescriptions   Pending Prescriptions Disp Refills   ALPRAZolam (XANAX) 1 MG tablet 60 tablet 0    Sig: TAKE ONE TABLET (1MG  TOTAL) BY MOUTH TWOTIMES DAILY AS NEEDED FOR ANXIETY   Patient states she will not be out of medication until Monday  Date of patient request: 12/23/2021 Last office visit: 08/24/2021 Date of last refill: 11/26/2021 Last refill amount: 60 tablets  Follow up time period per chart: n/a

## 2022-01-07 ENCOUNTER — Telehealth: Payer: Self-pay | Admitting: Registered Nurse

## 2022-01-07 NOTE — Telephone Encounter (Signed)
Pt called to request a TOC for herself, her husband and her daughter, stating she hates everyone at [facility].  The adults have [place], but her daughter has medicaid. Pt was informed that we do not accept medicaid. Pt stated she'd rather have the same MD for all 3 of them and would just keep searching for a PCP that accepts medicaid and will take all three of them.

## 2022-01-25 ENCOUNTER — Other Ambulatory Visit: Payer: Self-pay | Admitting: Lab

## 2022-01-25 ENCOUNTER — Other Ambulatory Visit: Payer: Self-pay | Admitting: Family Medicine

## 2022-01-25 ENCOUNTER — Telehealth: Payer: Self-pay | Admitting: Registered Nurse

## 2022-01-25 DIAGNOSIS — F419 Anxiety disorder, unspecified: Secondary | ICD-10-CM

## 2022-01-25 MED ORDER — ALPRAZOLAM 1 MG PO TABS: ORAL_TABLET | ORAL | 0 refills | Status: DC

## 2022-01-25 MED ORDER — ESCITALOPRAM OXALATE 20 MG PO TABS: 20.0000 mg | ORAL_TABLET | Freq: Every day | ORAL | 0 refills | Status: DC

## 2022-01-25 NOTE — Telephone Encounter (Signed)
Medication was sent in today looks like by Dr Carlota Raspberry

## 2022-01-25 NOTE — Telephone Encounter (Signed)
Xanax 1 mg LOV: 08/24/21 Last Refill:12/23/21 Upcoming appt: none  I left the pt VM stating that I sent this to one of our provider's but since the medication was filled in Sept they may or may not fill it . I advised she needs to est care with a new provider or she needs to call to est care with Di Kindle in Jan .

## 2022-01-25 NOTE — Telephone Encounter (Signed)
Encourage patient to contact the pharmacy for refills or they can request refills through [facility]  (Please schedule appointment if patient has not been seen in over a year)    [place] THIS SENT TO: [place], [place]: Escitalopram 20 mg   NOTES/[place] office please notify patient: It takes 48-72 hours to process rx refill requests Ask patient to call pharmacy to ensure rx is ready before heading there.

## 2022-01-25 NOTE — Telephone Encounter (Signed)
Last visit with Kelly Richard in May noted.  Doing well with Lexapro and alprazolam at that time.  67-month follow-up recommended.  Controlled substance database reviewed.  Alprazolam No. 60 filled on 11/26/2021, 12/27/2021.  Will refill alprazolam for 1 month, as well as for Lexapro.  I do see message where she is attempting to establish with new PCP.  We will need to see new PCP in the next 1 month if possible as that will be 64-month follow-up that was planned from [facility].  Let me know if there are questions.  Please advise patient that medications have been sent in.  Thanks.

## 2022-01-26 ENCOUNTER — Other Ambulatory Visit: Payer: Self-pay | Admitting: Lab

## 2022-01-26 NOTE — Telephone Encounter (Signed)
Pt is waiting on a call back to get scheduled provided more names within [facility] via [place]

## 2022-02-22 ENCOUNTER — Encounter: Payer: Self-pay | Admitting: Family Medicine

## 2022-02-24 ENCOUNTER — Ambulatory Visit: Payer: 59 | Admitting: Registered Nurse

## 2022-02-25 ENCOUNTER — Ambulatory Visit (INDEPENDENT_AMBULATORY_CARE_PROVIDER_SITE_OTHER): Payer: 59 | Admitting: Family Medicine

## 2022-02-25 ENCOUNTER — Encounter: Payer: Self-pay | Admitting: Family Medicine

## 2022-02-25 VITALS — BP 109/76 | HR 52 | Temp 97.8°F | Ht 67.0 in | Wt 223.0 lb

## 2022-02-25 DIAGNOSIS — F17210 Nicotine dependence, cigarettes, uncomplicated: Secondary | ICD-10-CM

## 2022-02-25 DIAGNOSIS — R69 Illness, unspecified: Secondary | ICD-10-CM | POA: Diagnosis not present

## 2022-02-25 DIAGNOSIS — E039 Hypothyroidism, unspecified: Secondary | ICD-10-CM

## 2022-02-25 DIAGNOSIS — Z800 Family history of malignant neoplasm of digestive organs: Secondary | ICD-10-CM | POA: Diagnosis not present

## 2022-02-25 DIAGNOSIS — M549 Dorsalgia, unspecified: Secondary | ICD-10-CM

## 2022-02-25 DIAGNOSIS — M545 Low back pain, unspecified: Secondary | ICD-10-CM | POA: Diagnosis not present

## 2022-02-25 DIAGNOSIS — G8929 Other chronic pain: Secondary | ICD-10-CM

## 2022-02-25 DIAGNOSIS — F419 Anxiety disorder, unspecified: Secondary | ICD-10-CM | POA: Diagnosis not present

## 2022-02-25 DIAGNOSIS — F17200 Nicotine dependence, unspecified, uncomplicated: Secondary | ICD-10-CM | POA: Insufficient documentation

## 2022-02-25 DIAGNOSIS — F32A Depression, unspecified: Secondary | ICD-10-CM | POA: Diagnosis not present

## 2022-02-25 MED ORDER — ALPRAZOLAM 1 MG PO TABS: ORAL_TABLET | ORAL | 5 refills | Status: DC

## 2022-02-25 MED ORDER — CYCLOBENZAPRINE HCL 10 MG PO TABS: ORAL_TABLET | ORAL | 0 refills | Status: DC

## 2022-02-25 MED ORDER — HYDROXYZINE HCL 25 MG PO TABS: ORAL_TABLET | ORAL | 5 refills | Status: DC

## 2022-02-25 NOTE — Assessment & Plan Note (Signed)
Stable on Lexapro 20 mg daily. 

## 2022-02-25 NOTE — Patient Instructions (Signed)
It was very nice to see you today!  I will refill your medications today.  Please continue to work on cutting down on smoking.  Please let us know if he would like for [place] to send on any nicotine replacement or medications to help with this.  We will see you back in about 6 months for your annual physical with labs.  Please come back to see [place] sooner if needed.  Take care, Dr Jimmey Ralph  PLEASE NOTE:  If you had any lab tests please let us know if you have not heard back within a few days. You may see your results on mychart before we have a chance to review them but we will give you a call once they are reviewed by [place]. If we ordered any referrals today, please let us know if you have not heard from their office within the next week.   Please try these tips to maintain a healthy lifestyle:  Eat at least 3 REAL meals and 1-2 snacks per day.  Aim for no more than 5 hours between eating.  If you eat breakfast, please do so within one hour of getting up.   Each meal should contain half fruits/vegetables, one quarter protein, and one quarter carbs (no bigger than a computer mouse)  Cut down on sweet beverages. This includes juice, soda, and sweet tea.   Drink at least 1 glass of water with each meal and aim for at least 8 glasses per day  Exercise at least 150 minutes every week.

## 2022-02-25 NOTE — Assessment & Plan Note (Signed)
Overall stable.  Last TSH at goal per patient report.  This was being managed by GYN however would like for [place] to take over care.  She is currently on Synthroid 112 mcg daily.  She does not need refill today.  We can recheck TSH when she comes back in about 6 months for CPE.

## 2022-02-25 NOTE — Assessment & Plan Note (Signed)
Patient was asked about her tobacco use today and was strongly advised to quit. Patient is currently contemplative. She is planning clinic for her New Year's resolution we reviewed treatment options to assist her quit smoking including NRT, Chantix, and Bupropion. Follow up at next office visit.   Total time spent counseling approximately 3 minutes.

## 2022-02-25 NOTE — Assessment & Plan Note (Signed)
Symptoms are mostly controlled on current regimen.  Does still have a few panic attacks but this seems to be manageable.  We will continue Lexapro 20 mg daily, Xanax 1 mg twice daily as needed, and hydroxyzine 25 mg 3 times daily as needed.  She has been on other SSRIs in the past without much improvement.  We briefly did discuss starting additional augmenting medication such as BuSpar however she declined.  Follow-up in 6 months.

## 2022-02-25 NOTE — Progress Notes (Signed)
Kelly Richard is a 36 y.o. female who presents today for an office visit.  She is transfering care to this office.   Assessment/Plan:  Chronic Problems Addressed Today: Anxiety Symptoms are mostly controlled on current regimen.  Does still have a few panic attacks but this seems to be manageable.  We will continue Lexapro 20 mg daily, Xanax 1 mg twice daily as needed, and hydroxyzine 25 mg 3 times daily as needed.  She has been on other SSRIs in the past without much improvement.  We briefly did discuss starting additional augmenting medication such as BuSpar however she declined.  Follow-up in 6 months.  Hypothyroidism Overall stable.  Last TSH at goal per patient report.  This was being managed by GYN however would like for [place] to take over care.  She is currently on Synthroid 112 mcg daily.  She does not need refill today.  We can recheck TSH when she comes back in about 6 months for CPE.  Depression Stable on Lexapro 20 mg daily.  Nicotine dependence with current use Patient was asked about her tobacco use today and was strongly advised to quit. Patient is currently contemplative. She is planning clinic for her New Year's resolution we reviewed treatment options to assist her quit smoking including NRT, Chantix, and Bupropion. Follow up at next office visit.   Total time spent counseling approximately 3 minutes.     Subjective:  HPI:  See A/p for status of chronic conditions.    ROS: Per HPI, otherwise a complete review of systems was negative.   PMH:  The following were reviewed and entered/updated in epic: Past Medical History:  Diagnosis Date   Anxiety    Degenerative disc disease    Depression    Family history of colon cancer    Family history of ovarian cancer    Family history of prostate cancer    History of substance abuse (HCC)    clean since 2015   Panic attacks    Postpartum depression    Thyroid disease    Vaginal Pap smear, abnormal    Patient Active  Problem List   Diagnosis Date Noted   Nicotine dependence with current use 02/25/2022   Chronic back pain 02/25/2022   Genetic testing 02/04/2021   Family history of colon cancer 01/20/2021   Family history of ovarian cancer 01/20/2021   Family history of prostate cancer 01/20/2021   Family history of breast cancer in mother 03/21/2019   Depression 11/02/2017   Anxiety 01/26/2010   Hypothyroidism 11/10/2009   Past Surgical History:  Procedure Laterality Date   TUBAL LIGATION      Family History  Problem Relation Age of Onset   Breast cancer Mother 56       d. 48   Cervical cancer Mother 83       again at 42   Non-Hodgkin's lymphoma Father    Colon cancer Father        now in remission   Colon polyps Maternal Uncle    Cirrhosis Maternal Uncle    Other Maternal Grandmother        hemmorhaged after surgery   Stroke Maternal Grandfather    Emphysema Paternal Grandfather    COPD Paternal Grandfather    Ovarian cancer Other        MGF's mother   Prostate cancer Other        MGF's father    Medications- reviewed and updated Current Outpatient Medications  Medication Sig  Dispense Refill   diclofenac (VOLTAREN) 75 MG EC tablet TAKE ONE TABLET (75MG  TOTAL) BY MOUTH TWO TIMES DAILY 30 tablet 0   escitalopram (LEXAPRO) 20 MG tablet Take 1 tablet (20 mg total) by mouth daily. 90 tablet 0   levothyroxine (SYNTHROID) 112 MCG tablet TAKE ONE TABLET ( TOTAL) BY MOUTH DAILY     ALPRAZolam (XANAX) 1 MG tablet TAKE ONE TABLET (1MG  TOTAL) BY MOUTH TWOTIMES DAILY AS NEEDED FOR ANXIETY 60 tablet 5   cyclobenzaprine (FLEXERIL) 10 MG tablet TAKE ONE TABLET (10MG  TOTAL) BY MOUTH THREE TIMES DAILY AS NEEDED FOR MUSCLE SPASMS 30 tablet 0   hydrOXYzine (ATARAX) 25 MG tablet TAKE ONE TABLET (25 MG TOTAL) BY MOUTH 3TIMES DAILY AS NEEDED. 30 tablet 5   No current facility-administered medications for this visit.    Allergies-reviewed and updated Allergies  Allergen Reactions    Amoxicillin Anaphylaxis   Penicillins Anaphylaxis    Social History   Socioeconomic History   Marital status: Married    Spouse name: Not on file   Number of children: Not on file   Years of education: Not on file   Highest education level: Not on file  Occupational History   Not on file  Tobacco Use   Smoking status: Every Day    Packs/day: 1.50    Years: 16.00    Total pack years: 24.00    Types: Cigarettes    Start date: 2006   Smokeless tobacco: Never  Vaping Use   Vaping Use: Never used  Substance and Sexual Activity   Alcohol use: Yes    Comment: occas   Drug use: Yes    Types: Marijuana   Sexual activity: Yes    Birth control/protection: Surgical    Comment: tubal  Other Topics Concern   Not on file  Social History Narrative   Not on file   Social Determinants of Health   Financial Resource Strain: Not on file  Food Insecurity: Not on file  Transportation Needs: Not on file  Physical Activity: Not on file  Stress: Not on file  Social Connections: Not on file          Objective:  Physical Exam: BP 109/76   Pulse (!) 52   Temp 97.8 F (36.6 C) (Temporal)   Ht 5\' 7"  (1.702 m)   Wt 223 lb (101.2 kg)   LMP 01/31/2022   SpO2 98%   BMI 34.93 kg/m   Gen: No acute distress, resting comfortably CV: Regular rate and rhythm with no murmurs appreciated Pulm: Normal work of breathing, clear to auscultation bilaterally with no crackles, wheezes, or rhonchi Neuro: Grossly normal, moves all extremities Psych: Normal affect and thought content  Time Spent: 45 minutes of total time was spent on the date of the encounter performing the following actions: chart review prior to seeing the patient, obtaining history, performing a medically necessary exam, counseling on the treatment plan, placing orders, and documenting in our EHR.  This does not include time spent discussing smoking cessation.       . 2007, MD 02/25/2022 10:54 AM

## 2022-03-01 ENCOUNTER — Telehealth: Payer: Self-pay | Admitting: Family Medicine

## 2022-03-01 ENCOUNTER — Encounter: Payer: Self-pay | Admitting: *Deleted

## 2022-03-01 NOTE — Telephone Encounter (Signed)
She will need a referral at age 36 for screening.

## 2022-03-01 NOTE — Telephone Encounter (Signed)
Pt states: -PCP team requested patient to call back with information about family history -Father was 38 years old when diagnoses with colon cancer.   Pt requests: -Contact by MyChart or phone as to what follow up is needed regarding screening, etc.

## 2022-03-01 NOTE — Telephone Encounter (Signed)
See note

## 2022-03-02 NOTE — Telephone Encounter (Signed)
Mychart msg has been sent with the information .

## 2022-03-23 ENCOUNTER — Other Ambulatory Visit: Payer: Self-pay | Admitting: Family Medicine

## 2022-03-23 ENCOUNTER — Encounter: Payer: Self-pay | Admitting: Family Medicine

## 2022-03-23 DIAGNOSIS — M545 Low back pain, unspecified: Secondary | ICD-10-CM

## 2022-03-23 MED ORDER — CYCLOBENZAPRINE HCL 10 MG PO TABS: ORAL_TABLET | ORAL | 0 refills | Status: DC

## 2022-03-25 ENCOUNTER — Telehealth: Payer: Self-pay | Admitting: Family Medicine

## 2022-03-25 NOTE — Telephone Encounter (Signed)
Patient is requesting her TB skin test results be faxed to her new employer [employer] [facility] @ [phone] as soon as possible.

## 2022-03-28 NOTE — Telephone Encounter (Signed)
TB Results faxed to [facility] care @ [phone]

## 2022-04-05 DIAGNOSIS — R69 Illness, unspecified: Secondary | ICD-10-CM | POA: Diagnosis not present

## 2022-04-05 DIAGNOSIS — Z3202 Encounter for pregnancy test, result negative: Secondary | ICD-10-CM | POA: Diagnosis not present

## 2022-04-05 DIAGNOSIS — F1120 Opioid dependence, uncomplicated: Secondary | ICD-10-CM | POA: Diagnosis not present

## 2022-04-07 DIAGNOSIS — R69 Illness, unspecified: Secondary | ICD-10-CM | POA: Diagnosis not present

## 2022-04-07 DIAGNOSIS — F419 Anxiety disorder, unspecified: Secondary | ICD-10-CM | POA: Diagnosis not present

## 2022-06-19 ENCOUNTER — Emergency Department (HOSPITAL_COMMUNITY)
Admission: EM | Admit: 2022-06-19 | Discharge: 2022-06-19 | Disposition: A | Discharge status: Home / Self Care | Payer: Medicaid Other | Attending: Emergency Medicine | Admitting: Emergency Medicine

## 2022-06-19 ENCOUNTER — Encounter (HOSPITAL_COMMUNITY): Payer: Self-pay

## 2022-06-19 ENCOUNTER — Other Ambulatory Visit: Payer: Self-pay

## 2022-06-19 DIAGNOSIS — N939 Abnormal uterine and vaginal bleeding, unspecified: Secondary | ICD-10-CM | POA: Diagnosis present

## 2022-06-19 LAB — URINALYSIS, ROUTINE W REFLEX MICROSCOPIC
Glucose, UA: NEGATIVE mg/dL
Ketones, ur: NEGATIVE mg/dL
Nitrite: POSITIVE — AB
Protein, ur: >300 mg/dL — AB
Specific Gravity, Urine: 1.02 (ref 1.005–1.030)
pH: 6.5 (ref 5.0–8.0)

## 2022-06-19 LAB — URINALYSIS, MICROSCOPIC (REFLEX): RBC / HPF: >50 RBC/hpf (ref 0–5)

## 2022-06-19 LAB — PREGNANCY, URINE: Preg Test, Ur: NEGATIVE

## 2022-06-19 NOTE — Discharge Instructions (Addendum)
You are not pregnant, please see your gynecologist in the clinic to deal with the chronic unusually heavy vaginal bleeding

## 2022-06-19 NOTE — ED Notes (Signed)
Patient left prior to discharge paperwork and final vitals being done

## 2022-06-19 NOTE — ED Provider Notes (Signed)
[facility] Provider Note   CSN: [id number] Arrival date & time: 06/19/22  1312     History  Chief Complaint  Patient presents with   Vaginal Bleeding    Kelly Richard is a 37 y.o. female.   Vaginal Bleeding  This patient is a 37 year old female, she states that she has been pregnant twice and has 2 babies, she has regular periods which are usually bleeding between 5 to 7 days however over the last couple of months she has had heavier bleeding associated with more cramping.  She thought that last month was unusual but it resolved, this months she has had similar symptoms with increasing cramping and bleeding.  Today her bleeding was much heavier than usual.  She reports that she is on birth control but is concerned that she may be pregnant and having a miscarriage.  She is not having any symptoms at this time including no lightheadedness no abdominal pain, no diarrhea, no rectal bleeding, she does not have any history of coagulopathy and does not take any blood thinning medications.  The patient reports to me "I just want to make sure I am not pregnant"    Home Medications Prior to Admission medications   Medication Sig Start Date End Date Taking? Authorizing Provider  ALPRAZolam Duanne Moron) 1 MG tablet TAKE ONE TABLET ('1MG'$  TOTAL) BY MOUTH TWOTIMES DAILY AS NEEDED FOR ANXIETY 02/25/22   Vivi Barrack, MD  cyclobenzaprine (FLEXERIL) 10 MG tablet TAKE ONE TABLET ('10MG'$  TOTAL) BY MOUTH THREE TIMES DAILY AS NEEDED FOR MUSCLE SPASMS 03/23/22   Vivi Barrack, MD  diclofenac (VOLTAREN) 75 MG EC tablet TAKE ONE TABLET ('75MG'$  TOTAL) BY MOUTH TWO TIMES DAILY 06/22/21   Maximiano Coss, NP  escitalopram (LEXAPRO) 20 MG tablet Take 1 tablet (20 mg total) by mouth daily. 01/25/22   Wendie Agreste, MD  hydrOXYzine (ATARAX) 25 MG tablet TAKE ONE TABLET (25 MG TOTAL) BY MOUTH 3TIMES DAILY AS NEEDED. 02/25/22   Vivi Barrack, MD  levothyroxine (SYNTHROID)  112 MCG tablet TAKE ONE TABLET (112MCG TOTAL) BY MOUTH DAILY 06/08/20   [provider]      Allergies    Amoxicillin and Penicillins    Review of Systems   Review of Systems  Genitourinary:  Positive for vaginal bleeding.  All other systems reviewed and are negative.   Physical Exam Updated Vital Signs BP (!) 128/97 (BP Location: Right Arm)   Pulse (!) 124   Temp 98.1 F (36.7 C) (Oral)   Resp (!) 21   Ht 1.727 m ('5\' 8"'$ )   Wt 99.8 kg   LMP 06/18/2022   SpO2 98%   BMI 33.45 kg/m  Physical Exam Vitals and nursing note reviewed.  Constitutional:      General: She is not in acute distress.    Appearance: She is well-developed.  HENT:     Head: Normocephalic and atraumatic.     Mouth/Throat:     Pharynx: No oropharyngeal exudate.  Eyes:     General: No scleral icterus.       Right eye: No discharge.        Left eye: No discharge.     Conjunctiva/sclera: Conjunctivae normal.     Pupils: Pupils are equal, round, and reactive to light.  Neck:     Thyroid: No thyromegaly.     Vascular: No JVD.  Cardiovascular:     Rate and Rhythm: Normal rate and regular rhythm.  Heart sounds: Normal heart sounds. No murmur heard.    No friction rub. No gallop.  Pulmonary:     Effort: Pulmonary effort is normal. No respiratory distress.     Breath sounds: Normal breath sounds. No wheezing or rales.  Abdominal:     General: Bowel sounds are normal. There is no distension.     Palpations: Abdomen is soft. There is no mass.     Tenderness: There is no abdominal tenderness.  Musculoskeletal:        General: No tenderness. Normal range of motion.     Cervical back: Normal range of motion and neck supple.     Right lower leg: No edema.     Left lower leg: No edema.  Lymphadenopathy:     Cervical: No cervical adenopathy.  Skin:    General: Skin is warm and dry.     Findings: No erythema or rash.  Neurological:     Mental Status: She is alert.     Coordination:  Coordination normal.  Psychiatric:        Behavior: Behavior normal.     ED Results / Procedures / Treatments   Labs (all labs ordered are listed, but only abnormal results are displayed) Labs Reviewed  URINALYSIS, ROUTINE W REFLEX MICROSCOPIC - Abnormal; Notable for the following components:      Result Value   Color, Urine RED (*)    APPearance CLOUDY (*)    Hgb urine dipstick LARGE (*)    Bilirubin Urine SMALL (*)    Protein, ur >300 (*)    Nitrite POSITIVE (*)    Leukocytes,Ua TRACE (*)    All other components within normal limits  URINALYSIS, MICROSCOPIC (REFLEX) - Abnormal; Notable for the following components:   Bacteria, UA MANY (*)    All other components within normal limits  PREGNANCY, URINE    EKG None  Radiology No results found.  Procedures Procedures    Medications Ordered in ED Medications - No data to display  ED Course/ Medical Decision Making/ A&P                             Medical Decision Making Amount and/or Complexity of Data Reviewed Labs: ordered.   The patient's exam is unremarkable, she does not have any abdominal tenderness, she does not appear anemic, she has no pale conjunctival, she has normal fingernail beds, she has a soft nontender abdomen without any guarding or peritoneal signs and normal bowel sounds.  Will check for pregnancy, the patient is not actually tachycardic on my exam.  She is agreeable to the plan.  Urine pregnancy negative, urinalysis without obvious infection, likely contaminant, patient actually left the ER to deal with a family crisis prior to getting a result so I communicated with her privately to let her know that she was not pregnant.  She will follow-up with her gynecologist as we discussed during her initial evaluation        Final Clinical Impression(s) / ED Diagnoses Final diagnoses:  Vaginal bleeding    Rx / DC Orders ED Discharge Orders     None         Noemi Chapel, MD 06/19/22  1358

## 2022-06-19 NOTE — ED Triage Notes (Signed)
Pt states she is on her menstrual cycle and has been having moderate cramping with increased vaginal bleeding since last night. States she normally does not have cramping with cycle, wants to be sure she is not having a miscarriage.   Pt A & O x 4, ambulatory in triage, NAD, tachycardic.

## 2022-08-10 ENCOUNTER — Ambulatory Visit: Payer: Medicaid Other | Admitting: Adult Health

## 2022-08-18 ENCOUNTER — Other Ambulatory Visit: Payer: Self-pay | Admitting: Family Medicine

## 2022-08-18 DIAGNOSIS — F419 Anxiety disorder, unspecified: Secondary | ICD-10-CM

## 2022-08-19 ENCOUNTER — Encounter: Payer: Self-pay | Admitting: Family Medicine

## 2022-08-19 ENCOUNTER — Other Ambulatory Visit: Payer: Self-pay | Admitting: *Deleted

## 2022-08-19 DIAGNOSIS — F419 Anxiety disorder, unspecified: Secondary | ICD-10-CM

## 2022-08-19 MED ORDER — ALPRAZOLAM 1 MG PO TABS
1.0000 mg | ORAL_TABLET | Freq: Two times a day (BID) | ORAL | 5 refills | Status: DC | PRN
Start: 2022-08-19 — End: 2023-02-10

## 2022-08-19 MED ORDER — LEVOTHYROXINE SODIUM 100 MCG PO TABS: 100.0000 ug | ORAL_TABLET | Freq: Every day | ORAL | 3 refills | Status: DC

## 2022-08-19 NOTE — Telephone Encounter (Signed)
Patient requests RX for  ALPRAZolam (XANAX) 1 MG tablet be resent (due to transmission failure-Pharmacy system was down) to:  [facility] - [place], [place] - [address] Phone: [phone]  Fax: [phone]

## 2022-08-19 NOTE — Telephone Encounter (Signed)
Caller was Tammy from [employer]. Caller states: - Pt currently at pharmacy stating that she got notification that meds were refilled via mychart  - Levothyroxine and alprazolam were not received. Below states error with e-prescription   Please re-send.

## 2022-08-19 NOTE — Telephone Encounter (Signed)
Rx synthroid was send to her pharmacy  Rx Xanax send to pcp for refills

## 2022-08-22 ENCOUNTER — Encounter: Payer: 59 | Admitting: Family Medicine

## 2022-09-02 ENCOUNTER — Encounter: Payer: 59 | Admitting: Family Medicine

## 2022-10-13 ENCOUNTER — Emergency Department (HOSPITAL_COMMUNITY)
Admission: EM | Admit: 2022-10-13 | Discharge: 2022-10-13 | Disposition: A | Discharge status: Home / Self Care | Payer: Medicaid Other | Source: Home / Self Care | Attending: Emergency Medicine | Admitting: Emergency Medicine

## 2022-10-13 ENCOUNTER — Emergency Department (HOSPITAL_COMMUNITY)
Admission: EM | Admit: 2022-10-13 | Discharge: 2022-10-13 | Discharge status: Against Medical Advice | Payer: Medicaid Other | Attending: Emergency Medicine | Admitting: Emergency Medicine

## 2022-10-13 ENCOUNTER — Emergency Department (HOSPITAL_COMMUNITY): Payer: Medicaid Other

## 2022-10-13 ENCOUNTER — Other Ambulatory Visit: Payer: Self-pay

## 2022-10-13 ENCOUNTER — Encounter (HOSPITAL_COMMUNITY): Payer: Self-pay

## 2022-10-13 DIAGNOSIS — S9032XA Contusion of left foot, initial encounter: Secondary | ICD-10-CM | POA: Insufficient documentation

## 2022-10-13 DIAGNOSIS — Y9301 Activity, walking, marching and hiking: Secondary | ICD-10-CM | POA: Insufficient documentation

## 2022-10-13 DIAGNOSIS — S99922A Unspecified injury of left foot, initial encounter: Secondary | ICD-10-CM | POA: Diagnosis present

## 2022-10-13 DIAGNOSIS — X501XXA Overexertion from prolonged static or awkward postures, initial encounter: Secondary | ICD-10-CM | POA: Insufficient documentation

## 2022-10-13 DIAGNOSIS — Z5321 Procedure and treatment not carried out due to patient leaving prior to being seen by health care provider: Secondary | ICD-10-CM | POA: Diagnosis not present

## 2022-10-13 DIAGNOSIS — S92355A Nondisplaced fracture of fifth metatarsal bone, left foot, initial encounter for closed fracture: Secondary | ICD-10-CM

## 2022-10-13 MED ORDER — IBUPROFEN 600 MG PO TABS: 600.0000 mg | ORAL_TABLET | Freq: Three times a day (TID) | ORAL | 0 refills | Status: DC

## 2022-10-13 MED ORDER — ACETAMINOPHEN ER 650 MG PO TBCR: 650.0000 mg | EXTENDED_RELEASE_TABLET | Freq: Three times a day (TID) | ORAL | 0 refills | Status: AC | PRN

## 2022-10-13 NOTE — ED Triage Notes (Signed)
Pt was seen earlier but states she had to leave.  Wants to be seen for left foot injury.  States she was going down steps & fell down.  Denies injury to everywhere except her foot.

## 2022-10-13 NOTE — Discharge Instructions (Addendum)
Wear the boot to support your injury, use the crutches as much as possible to minimize weightbearing of your foot.  Ice and elevation will help with pain and swelling over the next several days.  Use the medications prescribed to help you with pain relief as well.  Call Dr. Dallas Schimke for further evaluation and management of this injury.

## 2022-10-13 NOTE — ED Provider Notes (Signed)
[place] EMERGENCY DEPARTMENT AT [facility] Provider Note   CSN: [id number] Arrival date & time: 10/13/22  1813     History  Chief Complaint  Patient presents with   Foot Injury    left    Kelly Richard is a 37 y.o. female presenting for evaluation of left foot pain, bruising and swelling after she inverted her foot while ambulating around 1 PM today.  She has been ambulatory since the event but is concerned about the degree of swelling and pain.  She has taken Aleve prior to arrival with some improvement.  Denies any other injuries, she has no pain proximal to the foot and can wiggle her toes without too much discomfort.  The history is provided by the patient.       Home Medications Prior to Admission medications   Medication Sig Start Date End Date Taking? Authorizing Provider  ALPRAZolam Prudy Feeler) 1 MG tablet Take 1 tablet (1 mg total) by mouth 2 (two) times daily as needed for anxiety. 08/19/22   Ardith Dark, MD  cyclobenzaprine (FLEXERIL) 10 MG tablet TAKE ONE TABLET (10MG  TOTAL) BY MOUTH THREE TIMES DAILY AS NEEDED FOR MUSCLE SPASMS 03/23/22   Ardith Dark, MD  diclofenac (VOLTAREN) 75 MG EC tablet TAKE ONE TABLET (75MG  TOTAL) BY MOUTH TWO TIMES DAILY 06/22/21   Janeece Agee, NP  escitalopram (LEXAPRO) 20 MG tablet Take 1 tablet (20 mg total) by mouth daily. 01/25/22   Shade Flood, MD  hydrOXYzine (ATARAX) 25 MG tablet TAKE ONE TABLET (25 MG TOTAL) BY MOUTH 3TIMES DAILY AS NEEDED. 02/25/22   Ardith Dark, MD  levothyroxine (SYNTHROID) 100 MCG tablet Take 1 tablet (100 mcg total) by mouth daily. 08/19/22   Ardith Dark, MD      Allergies    Amoxicillin and Penicillins    Review of Systems   Review of Systems  Constitutional:  Negative for fever.  Musculoskeletal:  Positive for arthralgias and joint swelling. Negative for myalgias.  Neurological:  Negative for weakness and numbness.  All other systems reviewed and are  negative.   Physical Exam Updated Vital Signs BP 117/81   Pulse (!) 104   Temp 97.8 F (36.6 C) (Oral)   Ht 5\' 8"  (1.727 m)   Wt 99.8 kg   SpO2 96%   BMI 33.45 kg/m  Physical Exam Constitutional:      Appearance: She is well-developed.  HENT:     Head: Atraumatic.  Cardiovascular:     Comments: Pulses equal bilaterally Musculoskeletal:        General: Tenderness present.     Cervical back: Normal range of motion.     Left foot: Normal capillary refill. Swelling and tenderness present. Normal pulse.     Comments: Significant mid lateral left foot edema and bruising.  Distal sensation is intact, dorsalis pedal pulses easily appreciated.  Ankle or lower leg pain or edema.  Skin:    General: Skin is warm and dry.  Neurological:     Mental Status: She is alert.     Sensory: No sensory deficit.     Motor: No weakness.     Deep Tendon Reflexes: Reflexes normal.     ED Results / Procedures / Treatments   Labs (all labs ordered are listed, but only abnormal results are displayed) Labs Reviewed - No data to display  EKG None  Radiology No results found.  Procedures Procedures    Medications Ordered in ED Medications -  No data to display  ED Course/ Medical Decision Making/ A&P                             Medical Decision Making Patient with significant bruising and swelling to the left foot, this could be a simple contusion, could also be a foot fracture or sprain.  Unfortunately patient eloped prior to getting her x-rays completed.  Amount and/or Complexity of Data Reviewed Radiology: ordered.           Final Clinical Impression(s) / ED Diagnoses Final diagnoses:  Injury of left foot, initial encounter    Rx / DC Orders ED Discharge Orders     None         Victoriano Lain 10/13/22 Wilfred Lacy, MD 10/14/22 1349

## 2022-10-13 NOTE — ED Provider Notes (Signed)
[place] [facility] AT [facility] Provider Note   CSN: [id number] Arrival date & time: 10/13/22  2058     History  Chief Complaint  Patient presents with   Foot Pain    Kelly Richard is a 37 y.o. female who returns after initially being seen by me several hours ago, she unexpectedly had to leave the department but returns for further evaluation of her left foot pain.  Reports bruising and swelling after she inverted the foot wall walking down steps around 1 PM today.  She has taken Aleve prior to arrival, continues to have persistent pain with weightbearing.  The history is provided by the patient.       Home Medications Prior to Admission medications   Medication Sig Start Date End Date Taking? Authorizing Provider  ALPRAZolam Prudy Feeler) 1 MG tablet Take 1 tablet (1 mg total) by mouth 2 (two) times daily as needed for anxiety. 08/19/22   Ardith Dark, MD  cyclobenzaprine (FLEXERIL) 10 MG tablet TAKE ONE TABLET (10MG  TOTAL) BY MOUTH THREE TIMES DAILY AS NEEDED FOR MUSCLE SPASMS 03/23/22   Ardith Dark, MD  diclofenac (VOLTAREN) 75 MG EC tablet TAKE ONE TABLET (75MG  TOTAL) BY MOUTH TWO TIMES DAILY 06/22/21   Janeece Agee, NP  escitalopram (LEXAPRO) 20 MG tablet Take 1 tablet (20 mg total) by mouth daily. 01/25/22   Shade Flood, MD  hydrOXYzine (ATARAX) 25 MG tablet TAKE ONE TABLET (25 MG TOTAL) BY MOUTH 3TIMES DAILY AS NEEDED. 02/25/22   Ardith Dark, MD  levothyroxine (SYNTHROID) 100 MCG tablet Take 1 tablet (100 mcg total) by mouth daily. 08/19/22   Ardith Dark, MD      Allergies    Amoxicillin and Penicillins    Review of Systems   Review of Systems  Constitutional:  Negative for fever.  Musculoskeletal:  Positive for arthralgias and joint swelling. Negative for myalgias.  Neurological:  Negative for weakness and numbness.    Physical Exam Updated Vital Signs BP 126/82 (BP Location: Right Arm)   Pulse (!) 105   Temp 98.6 F (37  C) (Oral)   Resp 17   Ht 5\' 8"  (1.727 m)   Wt 99.8 kg   LMP 09/24/2022 (Exact Date)   SpO2 97%   BMI 33.45 kg/m  Physical Exam Constitutional:      Appearance: She is well-developed.  HENT:     Head: Atraumatic.  Cardiovascular:     Comments: Pulses equal bilaterally Musculoskeletal:        General: Tenderness present.     Cervical back: Normal range of motion.     Comments: Tenderness to palpation, bruising and swelling at the midfoot dorsal lateral aspect.  She is tender to palpation along the fifth metacarpal tarsal.  No palpable deformities.  Distal sensation is intact.  She can wiggle her toes and her ankle without pain.  Skin:    General: Skin is warm and dry.  Neurological:     Mental Status: She is alert.     Sensory: No sensory deficit.     Motor: No weakness.     Deep Tendon Reflexes: Reflexes normal.     ED Results / Procedures / Treatments   Labs (all labs ordered are listed, but only abnormal results are displayed) Labs Reviewed - No data to display  EKG None  Radiology DG Foot Complete Left  Result Date: 10/13/2022 CLINICAL DATA:  Fall with foot pain EXAM: LEFT FOOT - COMPLETE 3+  VIEW COMPARISON:  None Available. FINDINGS: Acute nondisplaced fracture base of fifth metatarsal with intra-articular extension to the fifth TMT joint. No malalignment IMPRESSION: Acute nondisplaced fracture base of fifth metatarsal. Electronically Signed   By: Jasmine Pang M.D.   On: 10/13/2022 21:33    Procedures Procedures    Medications Ordered in ED Medications - No data to display  ED Course/ Medical Decision Making/ A&P                             Medical Decision Making Amount and/or Complexity of Data Reviewed Radiology: ordered.           Final Clinical Impression(s) / ED Diagnoses Final diagnoses:  None    Rx / DC Orders ED Discharge Orders     None         Victoriano Lain 10/13/22 2154    Bethann Berkshire, MD 10/14/22 1349

## 2022-10-13 NOTE — ED Notes (Signed)
Xray not done- pt says she has to go and will come back later. Pt ambulatory

## 2022-10-13 NOTE — ED Triage Notes (Signed)
Pt to ED from home c/o left foot pain after "rolling foot/ankle" pt has swelling/bruising noted to lateral dorsal aspect of left foot. Pulses intact.

## 2022-10-19 ENCOUNTER — Ambulatory Visit (INDEPENDENT_AMBULATORY_CARE_PROVIDER_SITE_OTHER): Payer: Medicaid Other | Admitting: Orthopedic Surgery

## 2022-10-19 ENCOUNTER — Encounter: Payer: Self-pay | Admitting: Orthopedic Surgery

## 2022-10-19 VITALS — BP 117/80 | HR 108 | Ht 67.0 in | Wt 196.0 lb

## 2022-10-19 DIAGNOSIS — S92355A Nondisplaced fracture of fifth metatarsal bone, left foot, initial encounter for closed fracture: Secondary | ICD-10-CM

## 2022-10-19 MED ORDER — HYDROCODONE-ACETAMINOPHEN 5-325 MG PO TABS: 1.0000 | ORAL_TABLET | Freq: Four times a day (QID) | ORAL | 0 refills | Status: DC | PRN

## 2022-10-19 NOTE — Progress Notes (Signed)
New Patient Visit  Assessment: Kelly Richard is a 37 y.o. female with the following: 1. Closed nondisplaced fracture of fifth metatarsal bone of left foot, initial encounter  Plan: Kelly Richard rolled her left ankle, and sustained a minimally displaced fracture of the base of the fifth metatarsal.  She has bruising, swelling and tenderness currently.  Anticipate that this will heal without issues.  Okay for her to bear weight through her heel.  Continue wearing the boot.  I provided her with a limited prescription for hydrocodone.  I would like to see her back in approximately 3 weeks for repeat evaluation.  If she has issues, she will contact the clinic.  Follow-up: Return in about 3 weeks (around 11/09/2022).  Subjective:  Chief Complaint  Patient presents with   Foot Pain    Left foot fracture all the way through on the side of the foot severe pain   date of injury July 4 the stepped on a brick     History of Present Illness: Kelly Richard is a 37 y.o. female who presents for evaluation of left foot pain.  Approximately 1 week ago, she stepped on a brick, and rolled her ankle.  She had immediate pain.  She was seen in emergency department.  She was noted to have a fracture of the fifth metatarsal.  She was placed in a walking boot.  Since then, she continues to have pain.  She is using crutches.  She been taking meloxicam, with limited improvement in her pain.  No prior injuries to her left foot.  She is asking if she can soak her foot in Epsom salts.   Review of Systems: No fevers or chills No numbness or tingling No chest pain No shortness of breath No bowel or bladder dysfunction No GI distress No headaches   Medical History:  Past Medical History:  Diagnosis Date   Anxiety    Degenerative disc disease    Depression    Family history of colon cancer    Family history of ovarian cancer    Family history of prostate cancer    History of substance abuse (HCC)     clean since 2015   Panic attacks    Postpartum depression    Thyroid disease    Vaginal Pap smear, abnormal     Past Surgical History:  Procedure Laterality Date   TUBAL LIGATION      Family History  Problem Relation Age of Onset   Breast cancer Mother 47       d. 34   Cervical cancer Mother 16       again at 46   Colon cancer Father        now in remission   Non-Hodgkin's lymphoma Father    Other Maternal Grandmother        hemmorhaged after surgery   Stroke Maternal Grandfather    Emphysema Paternal Grandfather    COPD Paternal Grandfather    Colon polyps Maternal Uncle    Cirrhosis Maternal Uncle    Ovarian cancer Other        MGF's mother   Prostate cancer Other        MGF's father   Social History   Tobacco Use   Smoking status: Every Day    Packs/day: 1.50    Years: 16.00    Additional pack years: 0.00    Total pack years: 24.00    Types: Cigarettes    Start date: 2006  Smokeless tobacco: Never  Vaping Use   Vaping Use: Never used  Substance Use Topics   Alcohol use: Yes    Comment: occas   Drug use: Yes    Types: Marijuana    Allergies  Allergen Reactions   Amoxicillin Anaphylaxis   Penicillins Anaphylaxis    Current Meds  Medication Sig   acetaminophen (TYLENOL 8 HOUR) 650 MG CR tablet Take 1 tablet (650 mg total) by mouth every 8 (eight) hours as needed for pain.   ALPRAZolam (XANAX) 1 MG tablet Take 1 tablet (1 mg total) by mouth 2 (two) times daily as needed for anxiety.   cyclobenzaprine (FLEXERIL) 10 MG tablet TAKE ONE TABLET (10MG  TOTAL) BY MOUTH THREE TIMES DAILY AS NEEDED FOR MUSCLE SPASMS   HYDROcodone-acetaminophen (NORCO/VICODIN) 5-325 MG tablet Take 1 tablet by mouth every 6 (six) hours as needed for moderate pain.   hydrOXYzine (ATARAX) 25 MG tablet TAKE ONE TABLET (25 MG TOTAL) BY MOUTH 3TIMES DAILY AS NEEDED.   ibuprofen (ADVIL) 600 MG tablet Take 1 tablet (600 mg total) by mouth 3 (three) times daily.   levothyroxine  (SYNTHROID) 100 MCG tablet Take 1 tablet (100 mcg total) by mouth daily.    Objective: BP 117/80   Pulse (!) 108   Ht 5\' 7"  (1.702 m)   Wt 196 lb (88.9 kg)   LMP 09/24/2022 (Exact Date)   BMI 30.70 kg/m   Physical Exam:  General: Alert and oriented. and No acute distress. Gait: Ambulates with the assistance of a crutches.  Left foot with swelling and bruising over the lateral border of the foot.  She does have some bruising into the lesser toes.  Tenderness palpation of the base of the fifth metatarsal.  Pain with inversion of the ankle.  Toes warm well-perfused.    IMAGING: I personally reviewed images previously obtained from the ED   X-rays left foot were obtained in the emergency department.  Minimally displaced fracture through the base of fifth metatarsal.   New Medications:  Meds ordered this encounter  Medications   HYDROcodone-acetaminophen (NORCO/VICODIN) 5-325 MG tablet    Sig: Take 1 tablet by mouth every 6 (six) hours as needed for moderate pain.    Dispense:  20 tablet    Refill:  0      Oliver Barre, MD  10/19/2022 2:02 PM

## 2022-10-19 NOTE — Patient Instructions (Signed)
Okay to bear weight through your left heel.  Wear the boot.  Elevate your foot to help with swelling.  Ice the foot to help with swelling and pain.

## 2022-10-24 ENCOUNTER — Other Ambulatory Visit: Payer: Self-pay | Admitting: Orthopedic Surgery

## 2022-10-24 MED ORDER — HYDROCODONE-ACETAMINOPHEN 5-325 MG PO TABS: 1.0000 | ORAL_TABLET | Freq: Four times a day (QID) | ORAL | 0 refills | Status: DC | PRN

## 2022-10-30 ENCOUNTER — Other Ambulatory Visit: Payer: Self-pay | Admitting: Orthopedic Surgery

## 2022-10-31 MED ORDER — HYDROCODONE-ACETAMINOPHEN 5-325 MG PO TABS: 1.0000 | ORAL_TABLET | Freq: Four times a day (QID) | ORAL | 0 refills | Status: DC | PRN

## 2022-11-03 ENCOUNTER — Other Ambulatory Visit: Payer: Self-pay | Admitting: Orthopedic Surgery

## 2022-11-03 MED ORDER — HYDROCODONE-ACETAMINOPHEN 5-325 MG PO TABS: 1.0000 | ORAL_TABLET | Freq: Three times a day (TID) | ORAL | 0 refills | Status: DC | PRN

## 2022-11-15 ENCOUNTER — Ambulatory Visit: Payer: Medicaid Other | Admitting: Orthopedic Surgery

## 2022-11-15 ENCOUNTER — Other Ambulatory Visit (INDEPENDENT_AMBULATORY_CARE_PROVIDER_SITE_OTHER): Payer: Medicaid Other

## 2022-11-15 ENCOUNTER — Encounter: Payer: Self-pay | Admitting: Orthopedic Surgery

## 2022-11-15 VITALS — BP 113/80 | HR 116

## 2022-11-15 DIAGNOSIS — S92355A Nondisplaced fracture of fifth metatarsal bone, left foot, initial encounter for closed fracture: Secondary | ICD-10-CM

## 2022-11-15 MED ORDER — IBUPROFEN 600 MG PO TABS: 600.0000 mg | ORAL_TABLET | Freq: Three times a day (TID) | ORAL | 0 refills | Status: AC | PRN

## 2022-11-15 NOTE — Progress Notes (Signed)
Return patient Visit  Assessment: Kelly Richard is a 37 y.o. female with the following: 1. Closed nondisplaced fracture of fifth metatarsal bone of left foot, subsequent encounter  Plan: Danna Hefty Wentzel rolled her left ankle, and sustained a minimally displaced fracture of the base of the fifth metatarsal.  Injury was sustained 1 month ago.  Radiographs are stable.  Pain is improving.  Improved swelling and bruising.  She still has some residual tenderness over the lateral border of her left foot.  Continue to use the boot for another 2 weeks.  Okay to remove the boot at home.  Bear weight through the heel.  In 2 weeks, she can transition to regular shoe.  Follow-up in 1 month.   Follow-up: Return in about 4 weeks (around 12/13/2022).  Subjective:  Chief Complaint  Patient presents with   Foot Injury    Left/ 5th MT fracture 10/13/22    History of Present Illness: Kelly Richard is a 37 y.o. female who returns for evaluation of left foot pain.  Approximately 1 month ago, she rolled her ankle, sustained a fracture at the base of the fifth metatarsal.  She continues to improve.  She has been taking pain medications.  She continues to use the boot, but is anxious to get out of the boot.  No new injuries.  Review of Systems: No fevers or chills No numbness or tingling No chest pain No shortness of breath No bowel or bladder dysfunction No GI distress No headaches    Objective: BP 113/80   Pulse (!) 116   Physical Exam:  General: Alert and oriented. and No acute distress. Gait: Ambulates with the assistance of a crutches.  Left foot with minimal bruising and swelling over the lateral border.  Mild tenderness to palpation at the base of the fifth metatarsal.  Sensation intact throughout the left foot.   IMAGING: I personally ordered and reviewed the following images  X-rays of the left foot were obtained in clinic today.  These are compared to prior x-rays.  There is a  minimally displaced fracture of the base of the fifth metatarsal.  There is evidence of healing.  No interval displacement.  No additional injuries.  No bony lesions.  Impression: Stable left fifth metatarsal base fracture  New Medications:  Meds ordered this encounter  Medications   ibuprofen (ADVIL) 600 MG tablet    Sig: Take 1 tablet (600 mg total) by mouth every 8 (eight) hours as needed.    Dispense:  60 tablet    Refill:  0      Oliver Barre, MD  11/15/2022 3:12 PM

## 2022-11-15 NOTE — Patient Instructions (Signed)
Continue to use the walking boot for the next 2 weeks.  Okay to remove when at home.  Recommend you bear weight through your heel.  In 2 weeks, he can remove the boot, and transition to a regular shoe.  At that point, you will slowly advance her weightbearing.  Elevate the foot to help with swelling.  Continue medicines like Tylenol or ibuprofen.  Updated ibuprofen prescription provided

## 2022-12-13 ENCOUNTER — Encounter: Payer: Medicaid Other | Admitting: Orthopedic Surgery

## 2022-12-13 ENCOUNTER — Other Ambulatory Visit: Payer: Self-pay | Admitting: Family Medicine

## 2022-12-13 DIAGNOSIS — F419 Anxiety disorder, unspecified: Secondary | ICD-10-CM

## 2023-02-10 ENCOUNTER — Other Ambulatory Visit: Payer: Self-pay | Admitting: Family Medicine

## 2023-02-10 DIAGNOSIS — F419 Anxiety disorder, unspecified: Secondary | ICD-10-CM

## 2023-02-10 NOTE — Telephone Encounter (Signed)
Needs office visit for refills.

## 2023-02-10 NOTE — Telephone Encounter (Signed)
Pt requesting refill for Alprazolam 1 mg. Last OV 02/2022.

## 2023-03-08 ENCOUNTER — Other Ambulatory Visit: Payer: Self-pay | Admitting: Family Medicine

## 2023-03-08 DIAGNOSIS — F419 Anxiety disorder, unspecified: Secondary | ICD-10-CM

## 2023-03-14 ENCOUNTER — Encounter: Payer: Self-pay | Admitting: Family Medicine

## 2023-03-14 ENCOUNTER — Ambulatory Visit (INDEPENDENT_AMBULATORY_CARE_PROVIDER_SITE_OTHER): Payer: No Typology Code available for payment source | Admitting: Family Medicine

## 2023-03-14 VITALS — BP 116/75 | HR 79 | Temp 97.8°F | Ht 67.0 in | Wt 196.6 lb

## 2023-03-14 DIAGNOSIS — F32A Depression, unspecified: Secondary | ICD-10-CM

## 2023-03-14 DIAGNOSIS — F419 Anxiety disorder, unspecified: Secondary | ICD-10-CM | POA: Diagnosis not present

## 2023-03-14 DIAGNOSIS — Z131 Encounter for screening for diabetes mellitus: Secondary | ICD-10-CM | POA: Diagnosis not present

## 2023-03-14 DIAGNOSIS — Z13220 Encounter for screening for lipoid disorders: Secondary | ICD-10-CM

## 2023-03-14 DIAGNOSIS — F17200 Nicotine dependence, unspecified, uncomplicated: Secondary | ICD-10-CM

## 2023-03-14 DIAGNOSIS — E039 Hypothyroidism, unspecified: Secondary | ICD-10-CM

## 2023-03-14 LAB — COMPREHENSIVE METABOLIC PANEL
ALT: 13 U/L (ref 0–35)
AST: 19 U/L (ref 0–37)
Albumin: 4.4 g/dL (ref 3.5–5.2)
Alkaline Phosphatase: 64 U/L (ref 39–117)
BUN: 10 mg/dL (ref 6–23)
CO2: 28 meq/L (ref 19–32)
Calcium: 9.5 mg/dL (ref 8.4–10.5)
Chloride: 104 meq/L (ref 96–112)
Creatinine, Ser: 0.68 mg/dL (ref 0.40–1.20)
GFR: 109.63 mL/min (ref >60.00)
Glucose, Bld: 84 mg/dL (ref 70–99)
Potassium: 4.7 meq/L (ref 3.5–5.1)
Sodium: 138 meq/L (ref 135–145)
Total Bilirubin: 0.5 mg/dL (ref 0.2–1.2)
Total Protein: 7.5 g/dL (ref 6.0–8.3)

## 2023-03-14 LAB — CBC
HCT: 45.7 % (ref 36.0–46.0)
Hemoglobin: 15.4 g/dL — ABNORMAL HIGH (ref 12.0–15.0)
MCHC: 33.6 g/dL (ref 30.0–36.0)
MCV: 94.4 fL (ref 78.0–100.0)
Platelets: 262 10*3/uL (ref 150.0–400.0)
RBC: 4.84 Mil/uL (ref 3.87–5.11)
RDW: 13.2 % (ref 11.5–15.5)
WBC: 12.6 10*3/uL — ABNORMAL HIGH (ref 4.0–10.5)

## 2023-03-14 LAB — HEMOGLOBIN A1C: Hgb A1c MFr Bld: 5.6 % (ref 4.6–6.5)

## 2023-03-14 LAB — LIPID PANEL
Cholesterol: 159 mg/dL (ref 0–200)
HDL: 48 mg/dL (ref >39.00)
LDL Cholesterol: 94 mg/dL (ref 0–99)
NonHDL: 111.4
Total CHOL/HDL Ratio: 3
Triglycerides: 88 mg/dL (ref 0.0–149.0)
VLDL: 17.6 mg/dL (ref 0.0–40.0)

## 2023-03-14 LAB — TSH: TSH: 44.45 u[IU]/mL — ABNORMAL HIGH (ref 0.35–5.50)

## 2023-03-14 MED ORDER — ALPRAZOLAM 1 MG PO TABS
1.0000 mg | ORAL_TABLET | Freq: Two times a day (BID) | ORAL | 5 refills | Status: DC | PRN
Start: 2023-03-14 — End: 2023-09-07

## 2023-03-14 MED ORDER — NICOTINE 21 MG/24HR TD PT24: 21.0000 mg | MEDICATED_PATCH | Freq: Every day | TRANSDERMAL | 0 refills | Status: AC

## 2023-03-14 NOTE — Assessment & Plan Note (Addendum)
Patient was asked about her tobacco use today and was strongly advised to quit. Patient is currently contemplative. We reviewed treatment options to assist her quit smoking including NRT, Chantix, and Bupropion.  She is interested in nicotine replacement.  Will start nicotine patch 21 mg daily.  She will try to wean off cigarettes as tolerated over the neck several weeks to months.  She is currently smoking about a pack and a half per day. She will send [place] a message in a few weeks via MyChart to let us know how she is doing.  Would consider decreasing dose of patch in a month or 2 if she is doing well.  Follow up at next office visit.   Total time spent counseling approximately 5 minutes.

## 2023-03-14 NOTE — Assessment & Plan Note (Signed)
On Synthroid 112 mcg daily.  Will recheck TSH today.  She feels like she is currently on a good dose.  Does not need refill today.

## 2023-03-14 NOTE — Patient Instructions (Signed)
It was very nice to see you today!  We we will refill your medications today.  We will check blood work.  I am glad that you are ready to quit smoking.  We will send a prescription for nicotine patch.  Please send [place] a message in a few weeks to let us know how you are doing.  We can gradually decrease the dose of the patch over the next couple of months.  Return in about 1 year (around 03/13/2024) for Annual Physical.   Take care, Dr Jimmey Ralph  PLEASE NOTE:  If you had any lab tests, please let us know if you have not heard back within a few days. You may see your results on mychart before we have a chance to review them but we will give you a call once they are reviewed by [place].   If we ordered any referrals today, please let us know if you have not heard from their office within the next week.   If you had any urgent prescriptions sent in today, please check with the pharmacy within an hour of our visit to make sure the prescription was transmitted appropriately.   Please try these tips to maintain a healthy lifestyle:  Eat at least 3 REAL meals and 1-2 snacks per day.  Aim for no more than 5 hours between eating.  If you eat breakfast, please do so within one hour of getting up.   Each meal should contain half fruits/vegetables, one quarter protein, and one quarter carbs (no bigger than a computer mouse)  Cut down on sweet beverages. This includes juice, soda, and sweet tea.   Drink at least 1 glass of water with each meal and aim for at least 8 glasses per day  Exercise at least 150 minutes every week.

## 2023-03-14 NOTE — Progress Notes (Signed)
   Kelly Richard is a 37 y.o. female who presents today for an office visit.  Assessment/Plan:  Chronic Problems Addressed Today: Anxiety Symptoms are well-controlled.  No longer on Lexapro.  She has Xanax 1 mg twice daily as needed.  Uses sporadically.  Will continue current regimen for now.  Refill Xanax.  She is tolerating well without any significant side effects.  Database without red flags.  Follow-up in 6 to 12 months.   Hypothyroidism On Synthroid 112 mcg daily.  Will recheck TSH today.  She feels like she is currently on a good dose.  Does not need refill today.  Depression She stopped Lexapro several months ago.  Mood has been well-controlled.  Follow-up again in 6 to 12 months though she will let us know if she has any worsening symptoms between now and her next visit.  Nicotine dependence with current use Patient was asked about her tobacco use today and was strongly advised to quit. Patient is currently contemplative. We reviewed treatment options to assist her quit smoking including NRT, Chantix, and Bupropion.  She is interested in nicotine replacement.  Will start nicotine patch 21 mg daily.  She will try to wean off cigarettes as tolerated over the neck several weeks to months.  She is currently smoking about a pack and a half per day. She will send [place] a message in a few weeks via MyChart to let us know how she is doing.  Would consider decreasing dose of patch in a month or 2 if she is doing well.  Follow up at next office visit.   Total time spent counseling approximately 5 minutes.      Subjective:  HPI:  See assessment / plan for status of chronic conditions.  Patient was last seen here a year ago.  Since our last visit she did fracture her foot few months ago.  She was following with orthopedics however this has resolved.  She stopped taking her Lexapro a few months ago. She has done well off this. Her mood has been well controlled.        Objective:  Physical  Exam: BP 116/75   Pulse 79   Temp 97.8 F (36.6 C) (Temporal)   Ht 5\' 7"  (1.702 m)   Wt 196 lb 9.6 oz (89.2 kg)   SpO2 99%   BMI 30.79 kg/m   Gen: No acute distress, resting comfortably CV: Regular rate and rhythm with no murmurs appreciated Pulm: Normal work of breathing, clear to auscultation bilaterally with no crackles, wheezes, or rhonchi Neuro: Grossly normal, moves all extremities Psych: Normal affect and thought content      Kelly Richard M. Jimmey Ralph, MD 03/14/2023 11:31 AM

## 2023-03-14 NOTE — Assessment & Plan Note (Signed)
She stopped Lexapro several months ago.  Mood has been well-controlled.  Follow-up again in 6 to 12 months though she will let us know if she has any worsening symptoms between now and her next visit.

## 2023-03-14 NOTE — Assessment & Plan Note (Signed)
Symptoms are well-controlled.  No longer on Lexapro.  She has Xanax 1 mg twice daily as needed.  Uses sporadically.  Will continue current regimen for now.  Refill Xanax.  She is tolerating well without any significant side effects.  Database without red flags.  Follow-up in 6 to 12 months.

## 2023-03-16 NOTE — Progress Notes (Signed)
Her thyroid levels are not at goal.  Currently please verify that she has been taking her Synthroid 112 mcg daily appropriately?  If so recommend that we increase to 125 mcg daily and recheck in 4 to 6 weeks.  Her white blood cell count was slightly elevated as well.  We can recheck this again in a few weeks.  Please place future order for both TSH and CBC.

## 2023-03-18 ENCOUNTER — Encounter: Payer: Self-pay | Admitting: Family Medicine

## 2023-03-20 ENCOUNTER — Other Ambulatory Visit: Payer: Self-pay | Admitting: *Deleted

## 2023-03-20 DIAGNOSIS — E039 Hypothyroidism, unspecified: Secondary | ICD-10-CM

## 2023-03-20 DIAGNOSIS — D729 Disorder of white blood cells, unspecified: Secondary | ICD-10-CM

## 2023-03-20 NOTE — Telephone Encounter (Signed)
See results note. 

## 2023-09-07 ENCOUNTER — Other Ambulatory Visit: Payer: Self-pay | Admitting: Family Medicine

## 2023-09-07 DIAGNOSIS — F419 Anxiety disorder, unspecified: Secondary | ICD-10-CM

## 2023-10-07 ENCOUNTER — Other Ambulatory Visit: Payer: Self-pay | Admitting: Family Medicine

## 2023-10-07 DIAGNOSIS — F419 Anxiety disorder, unspecified: Secondary | ICD-10-CM

## 2023-10-09 ENCOUNTER — Encounter: Payer: Self-pay | Admitting: Family Medicine

## 2023-10-09 ENCOUNTER — Other Ambulatory Visit: Payer: Self-pay | Admitting: Family Medicine

## 2023-10-09 DIAGNOSIS — F419 Anxiety disorder, unspecified: Secondary | ICD-10-CM

## 2023-10-09 NOTE — Telephone Encounter (Signed)
 Copied from CRM [id number]. Topic: Clinical - Medication Refill >> Oct 09, 2023 10:00 AM [place] M wrote: Medication: ALPRAZolam  (XANAX ) 1 MG tablet  Has the patient contacted their pharmacy? Yes (Agent: If no, request that the patient contact the pharmacy for the refill. If patient does not wish to contact the pharmacy document the reason why and proceed with request.) (Agent: If yes, when and what did the pharmacy advise?)  This is the patient's preferred pharmacy:  [facility] - [place], [place] - [address] [place] [address] Phone: [phone] Fax: [phone]  Is this the correct pharmacy for this prescription? Yes If no, delete pharmacy and type the correct one.   Has the prescription been filled recently? Yes  Is the patient out of the medication? Yes  Has the patient been seen for an appointment in the last year OR does the patient have an upcoming appointment? Yes  Can we respond through MyChart? Yes  Agent: Please be advised that Rx refills may take up to 3 business days. We ask that you follow-up with your pharmacy.

## 2023-11-04 ENCOUNTER — Ambulatory Visit (INDEPENDENT_AMBULATORY_CARE_PROVIDER_SITE_OTHER)

## 2023-11-04 ENCOUNTER — Ambulatory Visit
Admission: EM | Admit: 2023-11-04 | Discharge: 2023-11-04 | Disposition: A | Discharge status: Home / Self Care | Attending: Family Medicine | Admitting: Family Medicine

## 2023-11-04 DIAGNOSIS — M25572 Pain in left ankle and joints of left foot: Secondary | ICD-10-CM | POA: Diagnosis not present

## 2023-11-04 MED ORDER — IBUPROFEN 800 MG PO TABS: 800.0000 mg | ORAL_TABLET | Freq: Three times a day (TID) | ORAL | 0 refills | Status: AC | PRN

## 2023-11-04 MED ORDER — KETOROLAC TROMETHAMINE 30 MG/ML IJ SOLN
30.0000 mg | Freq: Once | INTRAMUSCULAR | Status: AC
  Administered 2023-11-04: 30 mg via INTRAMUSCULAR

## 2023-11-04 NOTE — ED Triage Notes (Signed)
 Pt reports left ankle injury, walking foot rolled on a hill, swelling and bruising present pain in the foot and the ankle.

## 2023-11-04 NOTE — ED Provider Notes (Signed)
 [facility]    CSN: [id number] Arrival date & time: 11/04/23  1217      History   Chief Complaint No chief complaint on file.   HPI Kelly Richard is a 38 y.o. female.   Patient presenting today with left lateral ankle pain and swelling after rolling the foot walking down a hill yesterday.  Denies numbness, tingling, loss of range of motion, injuries elsewhere from the fall.  So far not try anything over-the-counter for symptoms.    Past Medical History:  Diagnosis Date   Anxiety    Degenerative disc disease    Depression    Family history of colon cancer    Family history of ovarian cancer    Family history of prostate cancer    History of substance abuse (HCC)    clean since 2015   Panic attacks    Postpartum depression    Thyroid  disease    Vaginal Pap smear, abnormal     Patient Active Problem List   Diagnosis Date Noted   Nicotine  dependence with current use 02/25/2022   Chronic back pain 02/25/2022   Genetic testing 02/04/2021   Family history of colon cancer 01/20/2021   Family history of ovarian cancer 01/20/2021   Family history of prostate cancer 01/20/2021   Severe episode of recurrent major depressive disorder, without psychotic features (HCC) 07/09/2019   Spondylosis of lumbar region without myelopathy or radiculopathy 06/05/2019   Family history of breast cancer in mother 03/21/2019   Depression 11/02/2017   Anxiety 01/26/2010   Hypothyroidism 11/10/2009   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 Authorizing Provider  ibuprofen  (ADVIL ) 800 MG tablet Take 1 tablet (800 mg total) by  mouth every 8 (eight) hours as needed. 11/04/23  Yes Stuart Vernell Norris, PA-C  acetaminophen  (TYLENOL  8 HOUR) 650 MG CR tablet Take 1 tablet (650 mg total) by mouth every 8 (eight) hours as needed for pain. 10/13/22   Idol, Julie, PA-C  ALPRAZolam  (XANAX ) 1 MG tablet TAKE ONE TABLET (1MG  TOTAL) BY MOUTH TWOTIMES DAILY AS NEEDED FOR ANXIETY 10/09/23   Kennyth Worth HERO, MD  hydrOXYzine  (ATARAX ) 25 MG tablet TAKE ONE TABLET (25MG  TOTAL) BY MOUTH THREE TIMES DAILY AS NEEDED 09/07/23   Kennyth Worth HERO, MD  ibuprofen  (ADVIL ) 600 MG tablet Take 1 tablet (600 mg total) by mouth every 8 (eight) hours as needed. 11/15/22   Onesimo Oneil LABOR, MD  levothyroxine  (SYNTHROID ) 100 MCG tablet TAKE ONE TABLET ( TOTAL) BY MOUTH DAILY 09/07/23   Kennyth Worth HERO, MD  nicotine  (NICODERM CQ  - DOSED IN MG/24 HOURS) 21 mg/24hr patch Place 1 patch (21 mg total) onto the skin daily. 03/14/23   Kennyth Worth HERO, MD    Family History Family History  Problem Relation Age of Onset   Breast cancer Mother 63  d. 96   Cervical cancer Mother 36       again at 25   Colon cancer Father        now in remission   Non-Hodgkin's lymphoma Father    Other Maternal Grandmother        hemmorhaged after surgery   Stroke Maternal Grandfather    Emphysema Paternal Grandfather    COPD Paternal Grandfather    Colon polyps Maternal Uncle    Cirrhosis Maternal Uncle    Ovarian cancer Other        MGF's mother   Prostate cancer Other        MGF's father    Social History Social History   Tobacco Use   Smoking status: Every Day    Current packs/day: 1.50    Average packs/day: 1.5 packs/day for 19.6 years (29.3 ttl pk-yrs)    Types: Cigarettes    Start date: 2006   Smokeless tobacco: Never  Vaping Use   Vaping status: Never Used  Substance Use Topics   Alcohol use: Yes    Comment: occas   Drug use: Yes    Types: Marijuana     Allergies   Amoxicillin and Penicillins   Review of Systems Review of Systems Per  HPI  Physical Exam Triage Vital Signs ED Triage Vitals  Encounter Vitals Group     BP 11/04/23 1249 128/84     Girls Systolic BP Percentile --      Girls Diastolic BP Percentile --      Boys Systolic BP Percentile --      Boys Diastolic BP Percentile --      Pulse Rate 11/04/23 1249 79     Resp 11/04/23 1249 20     Temp 11/04/23 1249 98 F (36.7 C)     Temp Source 11/04/23 1249 Oral     SpO2 11/04/23 1249 97 %     Weight --      Height --      Head Circumference --      Peak Flow --      Pain Score 11/04/23 1252 10     Pain Loc --      Pain Education --      Exclude from Growth Chart --    No data found.  Updated Vital Signs BP 128/84 (BP Location: Right Arm)   Pulse 79   Temp 98 F (36.7 C) (Oral)   Resp 20   LMP 11/03/2023 (Exact Date)   SpO2 97%   Visual Acuity Right Eye Distance:   Left Eye Distance:   Bilateral Distance:    Right Eye Near:   Left Eye Near:    Bilateral Near:     Physical Exam Vitals and nursing note reviewed.  Constitutional:      Appearance: Normal appearance. She is not ill-appearing.  HENT:     Head: Atraumatic.  Eyes:     Extraocular Movements: Extraocular movements intact.     Conjunctiva/sclera: Conjunctivae normal.  Cardiovascular:     Rate and Rhythm: Normal rate and regular rhythm.     Heart sounds: Normal heart sounds.  Pulmonary:     Effort: Pulmonary effort is normal.     Breath sounds: Normal breath sounds.  Musculoskeletal:        General: Swelling, tenderness and signs of injury present. Normal range of motion.     Cervical back: Normal range of motion and neck supple.     Comments: Left lateral ankle edematous,  tender to palpation without bony deformity palpable, range of motion intact though exam of this limited due to pain  Skin:    General: Skin is warm and dry.  Neurological:     Mental Status: She is alert and oriented to person, place, and time.     Motor: No weakness.     Gait: Gait normal.      Comments: Left lower extremity neurovascularly intact  Psychiatric:        Mood and Affect: Mood normal.        Thought Content: Thought content normal.        Judgment: Judgment normal.      UC Treatments / Results  Labs (all labs ordered are listed, but only abnormal results are displayed) Labs Reviewed - No data to display  EKG   Radiology DG Ankle Complete Left Result Date: 11/04/2023 CLINICAL DATA:  Ankle injury while hiking. EXAM: LEFT ANKLE COMPLETE - 3+ VIEW COMPARISON:  11/15/2022 FINDINGS: Benign sclerotic 1.1 cm lesion in the distal right tibial posterior cortex, unchanged. 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 No further imaging workup of this lesion is indicated. Soft tissue swelling overlies the lateral malleolus. The plafond and talar dome appear intact. Deformity at the base of the fifth metatarsal from old healed fracture. Tibiotalar joint effusion. Soft tissue swelling anterior to the distal tibia. Suspected growth arrest lines in the distal fibular metadiaphysis. No acute fracture identified. IMPRESSION: 1. Abnormal soft tissue swelling overlies the lateral malleolus and anterior to the distal tibia. 2. Tibiotalar joint effusion. 3. No acute fracture identified. If pain persists despite conservative therapy or atypical clinical presentation, MRI may be warranted for further characterization. 4. 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 No further imaging workup of this lesion is indicated. Electronically Signed   By: Ryan Salvage M.D.   On: 11/04/2023 13:16    Procedures Procedures (including critical care time)  Medications Ordered in UC Medications  ketorolac  (TORADOL ) 30 MG/ML injection 30 mg (30 mg Intramuscular Given 11/04/23 1343)    Initial Impression / Assessment and Plan / UC Course  I have reviewed the triage vital signs and the nursing notes.  Pertinent labs & imaging  results that were available during my care of the patient were reviewed by me and considered in my medical decision making (see chart for details).     X-ray of the left ankle negative for acute bony abnormality.  Suspect ankle strain/sprain.  Treat with IM Toradol , Tylenol , RICE protocol.  Ace wrap applied in clinic.  Return for worsening symptoms.  Final Clinical Impressions(s) / UC Diagnoses   Final diagnoses:  Acute left ankle pain     Discharge Instructions      Your x-ray was negative for fracture which is great news.  We have placed an Ace wrap and you may wear this as needed until feeling much better.  You may also elevate and ice the area to help with swelling.  We have given you a shot of Toradol  which is a very similar medication to ibuprofen  or Aleve  and this will stay in your system for about 48 hours.  Do not take any over-the-counter NSAID medications such as ibuprofen  or Aleve  for the 48-hour duration but you may continue to take Tylenol  for breakthrough pain.  After 48 hours, if you need to resume taking ibuprofen  you may do so.  I have prescribed some if needed.  ED Prescriptions     Medication Sig Dispense Auth. Provider   ibuprofen  (ADVIL ) 800 MG tablet Take 1 tablet (800 mg total) by mouth every 8 (eight) hours as needed. 21 tablet Stuart Vernell Norris, [place]      PDMP not reviewed this encounter.   Stuart Vernell Norris, [place] 11/04/23 1408

## 2023-11-04 NOTE — Discharge Instructions (Signed)
 Your x-ray was negative for fracture which is great news.  We have placed an Ace wrap and you may wear this as needed until feeling much better.  You may also elevate and ice the area to help with swelling.  We have given you a shot of Toradol  which is a very similar medication to ibuprofen  or Aleve  and this will stay in your system for about 48 hours.  Do not take any over-the-counter NSAID medications such as ibuprofen  or Aleve  for the 48-hour duration but you may continue to take Tylenol  for breakthrough pain.  After 48 hours, if you need to resume taking ibuprofen  you may do so.  I have prescribed some if needed.

## 2023-11-09 ENCOUNTER — Encounter: Payer: Self-pay | Admitting: Genetic Counselor

## 2024-03-04 ENCOUNTER — Other Ambulatory Visit: Payer: Self-pay | Admitting: Family Medicine

## 2024-03-04 DIAGNOSIS — F419 Anxiety disorder, unspecified: Secondary | ICD-10-CM

## 2024-03-19 ENCOUNTER — Encounter: Payer: No Typology Code available for payment source | Admitting: Family Medicine

## 2024-03-26 ENCOUNTER — Encounter: Payer: Self-pay | Admitting: Family Medicine

## 2024-03-29 ENCOUNTER — Other Ambulatory Visit: Payer: Self-pay | Admitting: Family Medicine

## 2024-03-29 DIAGNOSIS — F419 Anxiety disorder, unspecified: Secondary | ICD-10-CM

## 2024-04-01 ENCOUNTER — Encounter: Payer: Self-pay | Admitting: Family Medicine

## 2024-04-01 DIAGNOSIS — F419 Anxiety disorder, unspecified: Secondary | ICD-10-CM

## 2024-04-01 MED ORDER — ALPRAZOLAM 1 MG PO TABS: 1.0000 mg | ORAL_TABLET | Freq: Two times a day (BID) | ORAL | 5 refills | Status: AC | PRN

## 2024-04-29 ENCOUNTER — Other Ambulatory Visit: Payer: Self-pay | Admitting: Family Medicine

## 2024-04-29 DIAGNOSIS — F419 Anxiety disorder, unspecified: Secondary | ICD-10-CM

## 2024-05-02 ENCOUNTER — Other Ambulatory Visit: Payer: Self-pay | Admitting: Family Medicine

## 2024-05-02 DIAGNOSIS — F419 Anxiety disorder, unspecified: Secondary | ICD-10-CM
